# Patient Record
Sex: Male | Born: 1965 | Race: White | Hispanic: No | Marital: Married | Smoking: Current every day smoker
Health system: Southern US, Community
[De-identification: ages and names within clinical notes are randomized; demographics above are authoritative.]

## PROBLEM LIST (undated history)

## (undated) DIAGNOSIS — G4710 Hypersomnia, unspecified: Secondary | ICD-10-CM

## (undated) DIAGNOSIS — N529 Male erectile dysfunction, unspecified: Secondary | ICD-10-CM

## (undated) DIAGNOSIS — G47411 Narcolepsy with cataplexy: Secondary | ICD-10-CM

## (undated) DIAGNOSIS — IMO0002 Reserved for concepts with insufficient information to code with codable children: Secondary | ICD-10-CM

## (undated) HISTORY — DX: Reserved for concepts with insufficient information to code with codable children: IMO0002

## (undated) HISTORY — DX: Male erectile dysfunction, unspecified: N52.9

## (undated) HISTORY — DX: Narcolepsy with cataplexy: G47.411

## (undated) HISTORY — PX: OTHER SURGICAL HISTORY: SHX169

## (undated) HISTORY — PX: STERILIZATION: SHX533

## (undated) HISTORY — DX: Hypersomnia, unspecified: G47.10

## (undated) HISTORY — PX: CHOLECYSTECTOMY: SHX55

---

## 1997-10-31 ENCOUNTER — Inpatient Hospital Stay (HOSPITAL_COMMUNITY): Admission: EM | Admit: 1997-10-31 | Discharge: 1997-11-03 | Payer: Self-pay | Admitting: Emergency Medicine

## 2000-08-05 ENCOUNTER — Encounter: Payer: Self-pay | Admitting: Emergency Medicine

## 2000-08-05 ENCOUNTER — Encounter (INDEPENDENT_AMBULATORY_CARE_PROVIDER_SITE_OTHER): Payer: Self-pay | Admitting: Specialist

## 2000-08-05 ENCOUNTER — Inpatient Hospital Stay (HOSPITAL_COMMUNITY): Admission: EM | Admit: 2000-08-05 | Discharge: 2000-08-06 | Payer: Self-pay | Admitting: Emergency Medicine

## 2003-01-18 ENCOUNTER — Encounter: Payer: Self-pay | Admitting: Emergency Medicine

## 2003-01-18 ENCOUNTER — Emergency Department (HOSPITAL_COMMUNITY): Admission: EM | Admit: 2003-01-18 | Discharge: 2003-01-19 | Payer: Self-pay | Admitting: Emergency Medicine

## 2004-02-11 ENCOUNTER — Ambulatory Visit (HOSPITAL_COMMUNITY): Admission: RE | Admit: 2004-02-11 | Discharge: 2004-02-11 | Payer: Self-pay | Admitting: General Surgery

## 2004-02-11 ENCOUNTER — Encounter (INDEPENDENT_AMBULATORY_CARE_PROVIDER_SITE_OTHER): Payer: Self-pay | Admitting: *Deleted

## 2004-09-30 ENCOUNTER — Emergency Department (HOSPITAL_COMMUNITY): Admission: EM | Admit: 2004-09-30 | Discharge: 2004-09-30 | Payer: Self-pay | Admitting: Family Medicine

## 2006-03-28 ENCOUNTER — Emergency Department (HOSPITAL_COMMUNITY): Admission: EM | Admit: 2006-03-28 | Discharge: 2006-03-29 | Payer: Self-pay | Admitting: Emergency Medicine

## 2007-02-27 ENCOUNTER — Ambulatory Visit: Payer: Self-pay | Admitting: Family Medicine

## 2007-02-27 DIAGNOSIS — Z87891 Personal history of nicotine dependence: Secondary | ICD-10-CM | POA: Insufficient documentation

## 2007-02-27 DIAGNOSIS — G47419 Narcolepsy without cataplexy: Secondary | ICD-10-CM | POA: Insufficient documentation

## 2007-02-27 DIAGNOSIS — R12 Heartburn: Secondary | ICD-10-CM | POA: Insufficient documentation

## 2009-01-26 ENCOUNTER — Encounter: Admission: RE | Admit: 2009-01-26 | Discharge: 2009-01-26 | Payer: Self-pay | Admitting: Neurosurgery

## 2009-12-04 ENCOUNTER — Encounter: Admission: RE | Admit: 2009-12-04 | Discharge: 2009-12-04 | Payer: Self-pay | Admitting: Neurosurgery

## 2010-01-27 ENCOUNTER — Ambulatory Visit: Payer: Self-pay | Admitting: Family Medicine

## 2010-01-27 DIAGNOSIS — R21 Rash and other nonspecific skin eruption: Secondary | ICD-10-CM | POA: Insufficient documentation

## 2010-02-02 LAB — CONVERTED CEMR LAB
ALT: 81 units/L — ABNORMAL HIGH (ref 0–53)
AST: 41 units/L — ABNORMAL HIGH (ref 0–37)
Albumin: 4.2 g/dL (ref 3.5–5.2)
Alkaline Phosphatase: 61 units/L (ref 39–117)
Basophils Absolute: 0 10*3/uL (ref 0.0–0.1)
Basophils Relative: 0.4 % (ref 0.0–3.0)
Eosinophils Relative: 2.2 % (ref 0.0–5.0)
GFR calc non Af Amer: 81.68 mL/min (ref >60)
Glucose, Bld: 86 mg/dL (ref 70–99)
H Pylori IgG: NEGATIVE
HCT: 42.4 % (ref 39.0–52.0)
Hemoglobin: 14.4 g/dL (ref 13.0–17.0)
Lymphs Abs: 2.7 10*3/uL (ref 0.7–4.0)
Monocytes Relative: 10.1 % (ref 3.0–12.0)
Neutro Abs: 5 10*3/uL (ref 1.4–7.7)
Potassium: 5 meq/L (ref 3.5–5.1)
RBC: 4.42 M/uL (ref 4.22–5.81)
RDW: 13.6 % (ref 11.5–14.6)
Sodium: 143 meq/L (ref 135–145)
Total Protein: 6.7 g/dL (ref 6.0–8.3)

## 2010-02-03 ENCOUNTER — Encounter: Payer: Self-pay | Admitting: Family Medicine

## 2010-02-15 ENCOUNTER — Ambulatory Visit: Payer: Self-pay | Admitting: Family Medicine

## 2010-02-16 LAB — CONVERTED CEMR LAB
Albumin: 4.2 g/dL (ref 3.5–5.2)
GGT: 31 units/L (ref 7–51)
Total Protein: 6.9 g/dL (ref 6.0–8.3)

## 2010-02-17 LAB — CONVERTED CEMR LAB
Hep A IgM: NEGATIVE
Hep B C IgM: NEGATIVE
Hepatitis B Surface Ag: NEGATIVE

## 2010-03-22 ENCOUNTER — Encounter: Payer: Self-pay | Admitting: Family Medicine

## 2010-03-22 ENCOUNTER — Ambulatory Visit: Payer: Self-pay | Admitting: Family Medicine

## 2010-03-22 DIAGNOSIS — J069 Acute upper respiratory infection, unspecified: Secondary | ICD-10-CM | POA: Insufficient documentation

## 2010-03-22 DIAGNOSIS — F17200 Nicotine dependence, unspecified, uncomplicated: Secondary | ICD-10-CM | POA: Insufficient documentation

## 2010-03-22 DIAGNOSIS — J45909 Unspecified asthma, uncomplicated: Secondary | ICD-10-CM | POA: Insufficient documentation

## 2010-03-22 LAB — CONVERTED CEMR LAB
Nitrite: NEGATIVE
PSA: 0.36 ng/mL (ref 0.10–4.00)
Protein, U semiquant: NEGATIVE
Total CHOL/HDL Ratio: 5
Triglycerides: 102 mg/dL (ref 0.0–149.0)
Urobilinogen, UA: NEGATIVE
VLDL: 20.4 mg/dL (ref 0.0–40.0)

## 2010-03-25 ENCOUNTER — Encounter: Payer: Self-pay | Admitting: Family Medicine

## 2010-03-26 ENCOUNTER — Ambulatory Visit: Payer: Self-pay | Admitting: Family Medicine

## 2010-03-26 DIAGNOSIS — K921 Melena: Secondary | ICD-10-CM | POA: Insufficient documentation

## 2010-03-26 LAB — CONVERTED CEMR LAB: Fecal Occult Bld: POSITIVE

## 2010-03-29 ENCOUNTER — Encounter (INDEPENDENT_AMBULATORY_CARE_PROVIDER_SITE_OTHER): Payer: Self-pay | Admitting: *Deleted

## 2010-03-29 ENCOUNTER — Encounter: Payer: Self-pay | Admitting: Family Medicine

## 2010-07-22 NOTE — Letter (Signed)
Summary: [facility]  [facility]   Imported By: Lanelle Bal 02/11/2010 09:48:48  _____________________________________________________________________  External Attachment:    Type:   Image     Comment:   External Document

## 2010-07-22 NOTE — Letter (Signed)
Summary: New Patient letter  [facility]  [address] [place], [place] [address]   Phone: [phone]  Fax: [phone]       03/29/2010 MRN: [id number]  Marco Jackson [address] [place], [place]  [address]  Dear Marco Jackson,  Welcome to the Gastroenterology Division at [facility].    You are scheduled to see Dr. Jarold Motto on 05/04/2010 at 2:00PM on the 3rd floor at [facility], [address]  We ask that you try to arrive at our office 15 minutes prior to your appointment time to allow for check-in.  We would like you to complete the enclosed self-administered evaluation form prior to your visit and bring it with you on the day of your appointment.  We will review it with you.  Also, please bring a complete list of all your medications or, if you prefer, bring the medication bottles and we will list them.  Please bring your insurance card so that we may make a copy of it.  If your insurance requires a referral to see a specialist, please bring your referral form from your primary care physician.  Co-payments are due at the time of your visit and may be paid by cash, check or credit card.     Your office visit will consist of a consult with your physician (includes a physical exam), any laboratory testing he/she may order, scheduling of any necessary diagnostic testing (e.g. x-ray, ultrasound, CT-scan), and scheduling of a procedure (e.g. Endoscopy, Colonoscopy) if required.  Please allow enough time on your schedule to allow for any/all of these possibilities.    If you cannot keep your appointment, please call [phone] to cancel or reschedule prior to your appointment date.  This allows [place] the opportunity to schedule an appointment for another patient in need of care.  If you do not cancel or reschedule by 5 p.m. the business day prior to your appointment date, you will be charged a $50.00 late cancellation/no-show fee.    Thank you for choosing [facility] for your medical needs.  We appreciate the opportunity to care for you.  Please visit [place] at our website  to learn more about our practice.                     Sincerely,                                                             The Gastroenterology Division

## 2010-07-22 NOTE — Assessment & Plan Note (Signed)
Summary: red spots on chest/cbs   Vital Signs:  Patient profile:   45 year old male Height:      68.25 inches Weight:      216 pounds BMI:     32.72 Temp:     98.3 degrees F oral Pulse rate:   76 / minute BP sitting:   120 / 76  (left arm)  Vitals Entered By: Jeremy Johann CMA (January 27, 2010 11:19 AM) CC: RED SPOT ON CHEST, REFILL, Rash   History of Present Illness:  Rash      This is a 45 year old man who presents with Rash.  The symptoms began 6 months ago.  Pt states rash started with one spot on center of chestand now has spread across chest and some on arms as well.  It improved with fungal cream but then came right back.  The patient complains of macules and papules, but denies nodules, hives, welts, pustules, blisters, ulcers, itching, scaling, weeping, oozing, redness, increased warmth, and tenderness.  The rash is located on the neck, right arm, and left arm.  The patient denies the following symptoms: fever, headache, facial swelling, tongue swelling, burning, difficulty breathing, abdominal pain, nausea, vomiting, diarrhea, dizziness, sore throat, dysuria, eye symptoms, arthralgias, and vaginal discharge.  The patient denies history of recent tick bite, recent tick exposure, other insect bite, recent infection, recent antibiotic use, new medication, new clothing, new topical exposure, recent travel, pet/animal contact, thyroid disease, chronic liver disease, autoimmune disease, chronic edema, and prior STD.    Current Medications (verified): 1)  Provigil 200 Mg  Tabs (Modafinil) .Marland Kitchen.. 1 By Mouth Two Times A Day 2)  Protonix 40 Mg  Tbec (Pantoprazole Sodium) .Marland Kitchen.. 1 By Mouth Once Daily 3)  Prednisone 10 Mg Tabs (Prednisone) .... 3 By Mouth Once Daily For 3 Days Then 2 By Mouth Once Daily For 3 Days Then 1 By Mouth Once Daily For 3 Days  Allergies (verified): No Known Drug Allergies  Past History:  Past medical, surgical, family and social histories (including risk factors)  reviewed for relevance to current acute and chronic problems.  Past Surgical History: Reviewed history from 02/27/2007 and no changes required. Cholecystectomy Hemorrhoidectomy 2005--- [facility]  Family History: Reviewed history from 02/27/2007 and no changes required. ADOPTED  Social History: Reviewed history from 02/27/2007 and no changes required. 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     Impression & Recommendations:  Problem # 1:  SKIN RASH (ICD-782.1) prednisone taper benadryl as needed itching  Orders: Venipuncture (54098) TLB-BMP (Basic Metabolic Panel-BMET) (80048-METABOL) TLB-CBC Platelet - w/Differential (85025-CBCD) TLB-Hepatic/Liver Function Pnl (80076-HEPATIC) TLB-H. Pylori Abs(Helicobacter Pylori) (86677-HELICO) Depo- Medrol 80mg  (J1040) Admin of Therapeutic Inj  intramuscular or subcutaneous (11914)  Discussed medication use and symptom control.   Complete Medication List: 1)  Provigil 200 Mg Tabs (Modafinil) .Marland Kitchen.. 1 by mouth two times a day 2)  Protonix 40 Mg Tbec (Pantoprazole sodium) .Marland Kitchen.. 1 by mouth once daily 3)  Prednisone 10 Mg Tabs (Prednisone) .... 3 by mouth once daily for 3 days then 2 by mouth once daily for 3 days then 1 by mouth once daily for 3 days  Patient Instructions: 1)  schedule physical  Prescriptions: PREDNISONE 10 MG TABS (PREDNISONE) 3 by mouth once daily for 3 days then 2 by mouth once daily for 3 days then 1 by mouth once daily for 3 days  #18 x 0   Entered and Authorized by:   Loreen Freud DO   Signed by:   Loreen Freud DO on 01/27/2010   Method  used:   Electronically to        [facility]* (retail)       [address] [place], [place]  [address]       Ph: [phone] or [phone]       Fax: [phone]   RxID:   [id number] PROTONIX 40 MG  TBEC (PANTOPRAZOLE SODIUM) 1 by mouth once daily  #30 x 11   Entered and Authorized by:   Loreen Freud DO   Signed by:   Loreen Freud DO on 01/27/2010   Method used:   Electronically to        [facility]* (retail)       [address] [place], [place]  [address]       Ph: [phone] or [phone]       Fax: [phone]   RxID:   [id number]    Medication Administration  Injection # 1:    Medication: Depo- Medrol 80mg     Diagnosis: SKIN RASH (ICD-782.1)    Route: IM    Site: RUOQ gluteus    Exp Date: 10/19/2010    Lot #: [id number]    Mfr: [email or web address]    Patient tolerated injection without complications    Given by: Jeremy Johann CMA (January 27, 2010 11:48 AM)  Orders Added: 1)  Est. Patient Level III [57322] 2)  Venipuncture [02542] 3)  TLB-BMP (Basic Metabolic Panel-BMET) [80048-METABOL] 4)  TLB-CBC Platelet - w/Differential [85025-CBCD] 5)  TLB-Hepatic/Liver Function Pnl [80076-HEPATIC] 6)  TLB-H. Pylori Abs(Helicobacter Pylori) [86677-HELICO] 7)  Depo- Medrol 80mg  [J1040] 8)  Admin of Therapeutic Inj  intramuscular or subcutaneous [70623]

## 2010-07-22 NOTE — Assessment & Plan Note (Signed)
Summary: CPX//PH   Vital Signs:  Patient profile:   45 year old male Height:      69 inches Weight:      220.4 pounds Temp:     98.6 degrees F oral Pulse rate:   72 / minute Pulse rhythm:   regular BP sitting:   124 / 72  (right arm) Cuff size:   large  Vitals Entered By: Almeta Monas CMA Duncan Dull) (March 22, 2010 8:26 AM) CC: CPX/fasting, c/o rash on the chest   History of Present Illness: Pt here for cpe.  Pt still c/o rash on chest that is itchy.  Rash came back about 10 days after he finished prednisone last time---see last visit.   Pt is not putting anything on it now.     Preventive Screening-Counseling & Management  Alcohol-Tobacco     Alcohol type: OCASSIONALLY     Smoking Status: current     Smoking Cessation Counseling: yes     Packs/Day: 1.0     Year Started: 1980     Passive Smoke Exposure: yes  Caffeine-Diet-Exercise     Caffeine use/day: 5+     Does Patient Exercise: no     Type of exercise: WORK  Hep-HIV-STD-Contraception     HIV Risk: no     Dental Visit-last 6 months yes     Dental Care Counseling: not indicated; dental care within six months     TSE monthly: no     Testicular SE Education/Counseling not indicated; STE done regularly  Safety-Violence-Falls     Seat Belt Use: 100      Sexual History:  currently monogamous.    Current Medications (verified): 1)  Nuvigil 250 Mg Tabs (Armodafinil) .... 1/2 By Mouth Qid As Needed 2)  Protonix 40 Mg  Tbec (Pantoprazole Sodium) .Marland Kitchen.. 1 By Mouth Once Daily 3)  Diprolene Af 0.05 % Crea (Betamethasone Dipropionate Aug) .... Apply Two Times A Day 4)  Claritin 10 Mg Tabs (Loratadine) .Marland Kitchen.. 1 By Mouth Once Daily As Needed Allergies 5)  Flonase 50 Mcg/act Susp (Fluticasone Propionate) .... 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 ADOPTED  Social History: Reviewed history from 02/27/2007 and no changes required. 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 Eyes:  Denies blurring, discharge, double vision, eye irritation, eye pain, halos, itching, light sensitivity, red eye, vision loss-1 eye, and vision loss-both eyes; optho-- q2y, reading glasses . ENT:  Denies decreased hearing, difficulty swallowing, ear discharge, earache, hoarseness, nasal congestion, nosebleeds, postnasal drainage, ringing in ears, sinus pressure, and sore throat. CV:  Denies bluish discoloration of lips or nails, chest pain or discomfort, difficulty breathing at night, difficulty breathing while lying down, fainting, fatigue, leg cramps with exertion, lightheadness, near fainting, palpitations, shortness of breath with exertion, swelling of feet, swelling of hands, and weight gain. Resp:  Denies chest discomfort, chest pain with  inspiration, cough, coughing up blood, excessive snoring, hypersomnolence, morning headaches, pleuritic, shortness of breath, sputum productive, and wheezing. GI:  Denies abdominal pain, bloody stools, change in bowel habits, constipation, dark tarry stools, diarrhea, excessive appetite, gas, hemorrhoids, indigestion, loss of appetite, nausea, vomiting, vomiting blood, and yellowish skin color. GU:  Denies decreased libido, discharge, dysuria, erectile dysfunction, genital sores, hematuria, incontinence, nocturia, urinary frequency, and urinary hesitancy. MS:  Denies joint pain, joint redness, joint swelling, loss of strength, low back pain, mid back pain, muscle aches, muscle , cramps, muscle weakness, stiffness, and thoracic pain. Derm:  Denies changes in color of skin, changes in nail beds, dryness, excessive perspiration, flushing, hair loss, insect bite(s), itching, lesion(s), poor wound healing, and rash. Neuro:  Denies brief paralysis, difficulty with concentration, disturbances in coordination, falling down, headaches, inability to speak, memory loss, numbness, poor balance, seizures, sensation of room spinning, tingling, tremors, visual disturbances, and weakness; Dr Emilia Beck-- narcolepsy. Psych:  Denies alternate hallucination ( auditory/visual), anxiety, depression, easily angered, easily tearful, irritability, mental problems, panic attacks, sense of great danger, suicidal thoughts/plans, thoughts of violence, unusual visions or sounds, and thoughts /plans of harming others. Endo:  Denies cold intolerance, excessive hunger, excessive thirst, excessive urination, heat intolerance, polyuria, and weight change. Heme:  Denies abnormal bruising, bleeding, enlarge lymph nodes, fevers, pallor, and skin discoloration. Allergy:  Denies hives or rash, itching eyes, persistent infections, seasonal allergies, and sneezing.  Physical Exam  General:  Well-developed,well-nourished,in no acute distress;  alert,appropriate and cooperative throughout examination Skin:  papules on chest mid to Right  Cervical Nodes:  No lymphadenopathy noted Psych:  Cognition and judgment appear intact. Alert and cooperative with normal attention span and concentration. No apparent delusions, illusions, hallucinations   Impression & Recommendations:  Problem # 1:  PREVENTIVE HEALTH CARE (ICD-V70.0)  Orders: Venipuncture (04540) TLB-Lipid Panel (80061-LIPID) TLB-PSA (Prostate Specific Antigen) (84153-PSA) Specimen Handling (98119) UA Dipstick W/ Micro (manual) (81000) EKG w/ Interpretation (93000)  Reviewed preventive care protocols, scheduled due services, and updated immunizations.  Problem # 2:  TOBACCO USER (ICD-305.1)  Orders: Venipuncture (14782) TLB-Lipid Panel (80061-LIPID) TLB-PSA (Prostate Specific Antigen) (84153-PSA) EKG w/ Interpretation (93000)  Encouraged smoking cessation and discussed different methods for smoking cessation.   Problem # 3:  SKIN RASH (ICD-782.1)  His updated medication list for this problem includes:    Diprolene Af 0.05 % Crea (Betamethasone dipropionate aug) .Marland Kitchen... Apply two times a day  Orders: Dermatology Referral (Derma) EKG w/ Interpretation (93000)  Discussed medication use and symptom control.   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 1 by mouth once daily as needed allergies  Orders: EKG w/ Interpretation (93000)  Instructed on symptomatic treatment. Call if symptoms persist or worsen.   Complete Medication List: 1)  Nuvigil 250 Mg Tabs (Armodafinil) .... 1/2 by mouth qid as needed 2)  Protonix 40 Mg Tbec (Pantoprazole sodium) .Marland Kitchen.. 1 by mouth once daily 3)  Diprolene Af 0.05 % Crea (Betamethasone dipropionate aug) .... Apply two times  a day 4)   Claritin 10 Mg Tabs (Loratadine) .Marland Kitchen.. 1 by mouth once daily as needed allergies 5)  Flonase 50 Mcg/act Susp (Fluticasone propionate) .... 2 sprays each nostril once daily  Other Orders: Tdap => 13yrs IM ([address]) Admin 1st Vaccine ([address]) Prescriptions: FLONASE 50 MCG/ACT SUSP (FLUTICASONE PROPIONATE) 2 sprays each nostril once daily  #1 x 1   Entered and Authorized by:   Loreen Freud DO   Signed by:   Loreen Freud DO on 03/22/2010   Method used:   Electronically to        [facility]* (retail)       [address] [place], [place]  [address]       Ph: [phone] or [phone]       Fax: [phone]   RxID:   [id number] CLARITIN 10 MG TABS (LORATADINE) 1 by mouth once daily as needed allergies  #30 x 5   Entered and Authorized by:   Loreen Freud DO   Signed by:   Loreen Freud DO on 03/22/2010   Method used:   Electronically to        [facility]* (retail)       [address] [place], [place]  [address]       Ph: [phone] or [phone]       Fax: [phone]   RxID:   [id number] DIPROLENE AF 0.05 % CREA (BETAMETHASONE DIPROPIONATE AUG) apply two times a day  #30g x 1   Entered and Authorized by:   Loreen Freud DO   Signed by:   Loreen Freud DO on 03/22/2010   Method used:   Electronically to        [facility]* (retail)       [address] [place], [place]  [address]       Ph: [phone] or [phone]       Fax: [phone]   RxID:   [id number]   Flu Vaccine Next Due:  Refused      Immunizations Administered:  Tetanus Vaccine:    Vaccine Type: Tdap    Site: right deltoid    Mfr: Merck    Dose: 0.5 ml    Route: IM    Given by: Almeta Monas CMA (AAMA)    Exp. Date: 04/08/2012    Lot #: [id number]    VIS given: 05/07/08 version given March 22, 2010.   Immunizations Administered:  Tetanus Vaccine:    Vaccine Type: Tdap    Site: right deltoid    Mfr: Merck    Dose: 0.5 ml    Route: IM    Given by: Almeta Monas CMA (AAMA)    Exp. Date: 04/08/2012    Lot #: [id number]    VIS given: 05/07/08 version given March 22, 2010.   Laboratory Results   Urine Tests   Date/Time Reported: March 22, 2010 9:58 AM   Routine Urinalysis   Color: yellow Appearance: Clear Glucose: negative   (Normal Range: Negative) Bilirubin: negative   (Normal Range: Negative) Ketone: negative   (Normal Range: Negative) Spec. Gravity: 1.010   (Normal Range: 1.003-1.035) Blood: negative   (  Normal Range: Negative) pH: 5.0   (Normal Range: 5.0-8.0) Protein: negative   (Normal Range: Negative) Urobilinogen: negative   (Normal Range: 0-1) Nitrite: negative   (Normal Range: Negative) Leukocyte Esterace: negative   (Normal Range: Negative)    Comments: Floydene Flock  March 22, 2010 9:58 AM

## 2010-07-22 NOTE — Consult Note (Signed)
Summary: [facility]  [facility]   Imported By: Lanelle Bal 04/20/2010 12:24:40  _____________________________________________________________________  External Attachment:    Type:   Image     Comment:   External Document

## 2010-07-22 NOTE — Letter (Signed)
Summary: [facility]: Immunoassay Fecal Occult Blood (iFOB) Order Form  [facility] [place]  [address] [place], [place] [address]   Phone: [phone]  Fax: [phone]      [facility]: Immunoassay Fecal Occult Blood (iFOB) Order Form   March 25, 2010 MRN: [id number]   YUVRAJ PFEIFER 1965/09/01   Physicican Name: Dr.Lowne  Diagnosis Code: V56.71      Almeta Monas CMA (AAMA)

## 2010-08-27 ENCOUNTER — Ambulatory Visit (INDEPENDENT_AMBULATORY_CARE_PROVIDER_SITE_OTHER): Payer: 59 | Admitting: Family Medicine

## 2010-08-27 ENCOUNTER — Encounter: Payer: Self-pay | Admitting: Family Medicine

## 2010-08-27 DIAGNOSIS — H6980 Other specified disorders of Eustachian tube, unspecified ear: Secondary | ICD-10-CM

## 2010-08-31 NOTE — Assessment & Plan Note (Signed)
Summary: blood in ear, sore throat///sph--needed early morning appt///...   Vital Signs:  Patient profile:   45 year old male Height:      69 inches (175.26 cm) Weight:      220.38 pounds (100.17 kg) BMI:     32.66 Temp:     97.8 degrees F (36.56 degrees C) oral BP sitting:   120 / 70  (left arm) Cuff size:   large  Vitals Entered By: Lucious Groves CMA (August 27, 2010 9:00 AM) CC: Blood in ear and sore throat./kb Is Patient Diabetic? No Pain Assessment Patient in pain? no      Comments Patient notes that sore throat is better. He has been having congestion, and cough. He denies fever, HA, mucous production, chest pain, and SOB.   History of Present Illness: 45 yo man here today for blood in L ear.  uses Qtips each morning and 1 month ago noticed blood on Qtip.  able to hear.  no ear pain.  feels 'fluid' in ear.  no fevers.  + nasal congestion.  denies PND.  denies seasonal allergies but was previously on claritin..  Current Medications (verified): 1)  Protonix 40 Mg  Tbec (Pantoprazole Sodium) .Marland Kitchen.. 1 By Mouth Once Daily 2)  Provigil .Marland Kitchen.. 1 By Mouth Two Times A Day  Allergies (verified): No Known Drug Allergies  Review of Systems      See HPI  Physical Exam  General:  Well-developed,well-nourished,in no acute distress; alert,appropriate and cooperative throughout examination Head:  NCAT Eyes:  no injxn or inflammation Ears:  R ear normal L canal w/ small scab inferiorly, TM retracted Nose:  edematous turbinates and congestion, L>R   Impression & Recommendations:  Problem # 1:  DYSFUNCTION OF EUSTACHIAN TUBE (ICD-381.81) Assessment New pt's ear sxs are related to TM retraction likely caused by untreated seasonal allergies.  start steroid nasal spray.  advised pt to stop using Qtips.  Complete Medication List: 1)  Protonix 40 Mg Tbec (Pantoprazole sodium) .Marland Kitchen.. 1 by mouth once daily 2)  Provigil  .Marland Kitchen.. 1 by mouth two times a day 3)  Fluticasone Propionate 50 Mcg/act  Susp (Fluticasone propionate) .... 2 sprays each nostril once daily  Patient Instructions: 1)  Your ear discomfort is due to nasal congestion and seasonal allergies 2)  Use the nasal spray to decrease congestion and open your ear 3)  Drink plenty of fluids 4)  Call with any questions or concerns 5)  Enjoy your trip!!! Prescriptions: FLUTICASONE PROPIONATE 50 MCG/ACT  SUSP (FLUTICASONE PROPIONATE) 2 sprays each nostril once daily  #1 x 3   Entered and Authorized by:   Neena Rhymes MD   Signed by:   Neena Rhymes MD on 08/27/2010   Method used:   Electronically to        [facility]* (retail)       [address] [place], [place]  [address]       Ph: [phone] or [phone]       Fax: [phone]   RxID:   [id number]    Orders Added: 1)  Est. Patient Level III [[address]]

## 2010-10-30 ENCOUNTER — Encounter: Payer: Self-pay | Admitting: Family Medicine

## 2010-11-01 ENCOUNTER — Ambulatory Visit (INDEPENDENT_AMBULATORY_CARE_PROVIDER_SITE_OTHER): Payer: 59 | Admitting: Family Medicine

## 2010-11-01 ENCOUNTER — Encounter: Payer: Self-pay | Admitting: Family Medicine

## 2010-11-01 VITALS — BP 110/72 | HR 73 | Temp 98.8°F | Wt 218.4 lb

## 2010-11-01 DIAGNOSIS — Z720 Tobacco use: Secondary | ICD-10-CM

## 2010-11-01 DIAGNOSIS — N529 Male erectile dysfunction, unspecified: Secondary | ICD-10-CM | POA: Insufficient documentation

## 2010-11-01 DIAGNOSIS — F17200 Nicotine dependence, unspecified, uncomplicated: Secondary | ICD-10-CM

## 2010-11-01 MED ORDER — VARENICLINE TARTRATE 0.5 MG X 11 & 1 MG X 42 PO MISC: ORAL | Status: DC

## 2010-11-01 NOTE — Assessment & Plan Note (Signed)
rx chantix Encouraged pt to call 800 number for extra counseling over phone and mail

## 2010-11-01 NOTE — Assessment & Plan Note (Signed)
Check testosterone level viagra trial--- pt understands directions

## 2010-11-01 NOTE — Patient Instructions (Signed)
ED (Erectile Dysfunction)     Erectile Dysfunction (ED) is a very common condition occurring in almost all men at various times in their lives. It can be caused by fatigue, overwork, preoccupation with work, alcohol and numerous other drugs. This problem can also be psychogenic. That means the problem is caused in your mind. Do not worry, this problem is extremely common. Sometimes with failure of sexual function on a couple of occasions a man will worry enough so that this normal bodily function fails again. There are other problems with sexual function that may be caused by organic problems, such as diabetes, nerve disorders, and hardening of the arteries (arteriosclerosis and atherosclerosis).     DIAGNOSIS OF ED  Your caregiver can often diagnose ED by taking a history and doing a physical exam. Once your caregiver has made sure that you do not have an organic (caused by disease) problem you may be given medications to try.      TREATMENT  Once your caregiver has determined there are no other causes for your ED, medications may help with this problem. Your caregiver may even wish to give you medication and bypass some of the former work ups used in evaluation. A number of  medications have been approved in recent years to help with this problem. These medicines should not be taken more than once a day. There may be severe side effects if taken by men who are also taking nitroglycerin medications or alpha blockers. These are used for blood pressure control and prostate problems. Other injectable and topical medications have been approved for ED. Vacuum devices and implants have also been used.  Contact your caregiver and arrange for further evaluation as recommended.     Document Released: 06/03/2000  Document Re-Released: 04/03/2007  ExitCare Patient Information 2011 [employer].

## 2010-11-01 NOTE — Progress Notes (Signed)
  Subjective:    Patient ID: Marco Jackson, male    DOB: 15-Jan-1966, 45 y.o.   MRN: [id number]  HPI Pt here to discuss chantix.  He used it before and was successful for 6 months and then started again.  Pt also c/o ED and would like to have his testosterone checked.      Review of Systems  Constitutional: Negative.   Respiratory: Negative.   Cardiovascular: Negative.   Psychiatric/Behavioral: Negative.   All other systems reviewed and are negative.       Objective:   Physical Exam  Constitutional: He appears well-developed and well-nourished.  Cardiovascular: Normal rate, regular rhythm and normal heart sounds.   Pulmonary/Chest: Effort normal and breath sounds normal.  Psychiatric: He has a normal mood and affect. His behavior is normal. Judgment and thought content normal.          Assessment & Plan:

## 2010-11-03 ENCOUNTER — Encounter: Payer: Self-pay | Admitting: *Deleted

## 2010-11-03 LAB — TESTOSTERONE, FREE, TOTAL, SHBG
Sex Hormone Binding: 41 nmol/L (ref 13–71)
Testosterone: 515.74 ng/dL (ref 250–890)

## 2010-11-05 NOTE — Op Note (Signed)
[facility]  Patient:    Marco Jackson, Marco Jackson                         MRN: [id number] Proc. Date: 08/05/00 Adm. Date:  [id number] Attending:  Henrene Dodge                           Operative Report  PREOPERATIVE DIAGNOSIS:  Acute cholecystitis or biliary colic.  POSTOPERATIVE DIAGNOSIS:  Chronic cholecystitis with stones.  OPERATION:  Laparoscopic cholecystectomy.  SURGEON:  Anselm Pancoast. Zachery Dakins, M.D.  ASSISTANT:  Catalina Lunger, M.D.  ANESTHESIA:  General anesthesia.  HISTORY:  The patient is a 45 year old Caucasian male who presented to the emergency room after having the severe onset of epigastric pain approximately 9 p.m. on Friday evening after eating at a [employer].  Patient arrived in the emergency room at approximately midnight and was seen by the ER physician.  Laboratory studies showed a white count of 19,600.  He was having pretty moderate nausea, vomiting and epigastric pain and he was medicated and this morning, an ultrasound of the gallbladder was obtained which showed a stone within the distended gallbladder.  Patient was given Toradol and his acute symptoms appeared to subside.  I was asked to see him and on examination, his pain was still kind of mild epigastric, more to the right, but he certainly did not appear like a WBC of approximately 20,000.  I recommended that we obtain an acute abdominal series, which was unremarkable, and then I recommended that we proceed on with a laparoscopic cholecystectomy. His pain actually has subsided significantly now but with about 12 hours that he has been here in the emergency room, this most likely was a biliary colic attack.  Patient was given Unasyn 3 g and was taken to the operating suite.  DESCRIPTION OF PROCEDURE:  With induction of general anesthesia, endotracheal tube and an oral tube into the stomach, the abdomen was prepped with Betadine surgical scrubbing  solution and draped in a sterile manner.  A small incision was made below the umbilicus down through the fatty tissue, the fascia identified, a small opening made and the underlying peritoneum identified, which was opened carefully with a hemostat.  The traction suture was placed and Hasson cannula introduced.  On examination of the upper abdomen, he had a distended gallbladder but it was not really acutely inflamed.  There was no evidence of any other acute inflammation.  Later, after the gallbladder was removed, I did visualized the appendix and it appeared to be in its normal position and of normal appearance.  The gallbladder was retracted upward after the upper 10-mm trocar had been placed and the two lateral 5-mm trocars were placed by Dr. Luan Pulling after anesthetizing the fascia.  The peritoneum was opened up proximally.  The cystic duct was fairly short, not real dilated, and I placed a clip flush with the junction of the cystic duct and the gallbladder and then made a little nick in it proximally -- the bile was not under extreme pressure -- and then two clips were placed on the proximal portion of the cystic duct and it was divided.  I saw a couple of little branches of the cystic artery that were doubly clipped proximally and then these divided, freeing up the gallbladder bed.  The patient had real good hemostasis.  The gallbladder was freed from its bed and then  the camera was placed in the upper 10-mm trocar site and the gallbladder withdrawn through the fascial defect in the umbilicus.  The stone itself was about a centimeter in size.  We then reinserted the camera, looked around, visualizing the appendix, and the small bowel looked unremarkable with no evidence of any Meckels or anything.  I think that he actually had an episode of biliary colic and was having significant nausea and vomiting when he arrived and the WBC count was probably a little out of proportion to the  severity of his illness.  Patients umbilical fascia was closed with a figure-of-eight of 0 Vicryl.  The two lateral 5-mm trocars were withdrawn, there was no evidence of any bleeding, the CO2 released and the upper trocar withdrawn.  Patient tolerated procedure satisfactorily.  The subcutaneous wounds were closed with 4-0 Vicryl with Benzoin and Steri-Strips on the skin. DD:  08/05/00 TD:  08/06/00 Job: [id number]

## 2010-11-05 NOTE — Op Note (Signed)
Marco Jackson, Marco Jackson                            ACCOUNT NO.:  [id number]   MEDICAL RECORD NO.:  [id number]                   PATIENT TYPE:  AMB   LOCATION:  DAY                                  FACILITY[facility]  [facility]   PHYSICIAN:  Ollen Gross. Vernell Morgans, M.D.              DATE OF BIRTH:  1965/09/11   DATE OF PROCEDURE:  02/11/2004  DATE OF DISCHARGE:                                 OPERATIVE REPORT   PREOPERATIVE DIAGNOSIS:  Prolapsing internal hemorrhoids.   POSTOPERATIVE DIAGNOSIS:  Prolapsing internal hemorrhoids.   PROCEDURE:  Procedure for prolapsing hemorrhoids/hemorrhoidectomy.   SURGEON:  Ollen Gross. Carolynne Edouard, M.D.   ASSISTANT:  Anselm Pancoast. Zachery Dakins, M.D.   ANESTHESIA:  General endotracheal.   PROCEDURE:  After informed consent was obtained, the patient was brought to  the operating room and left in the supine position on the bed.  After  adequate induction of general anesthesia, the patient was then rolled into a  prone position onto the operating room table, and all pressure points were  padded.  The patient was placed in a jack-knife position, and his buttocks  were retracted laterally with tape.  His perirectal region and rectum were  prepped with Betadine and draped in the usual sterile manner.  A deep  Pfannenstiel retractor was used to examine the rectum.  He was noted to have  significant internal hemorrhoidal tissue, but no other abnormalities were  identified.  The perirectal area and hemorrhoids were infiltrated initially  with 1% lidocaine with epinephrine and 1 cc of Wydase.  The tissue was  massaged gently for several minutes.  The area is then further anesthetized  with just 0.25% Marcaine.  The deep fans of the retractor was then placed  into the rectum again at a distance of about 4-5 cm deep to the dentate  line.  A 2-0 Prolene purse-string stitch was placed circumferentially around  the rectum just to the mucosa and submucosa, taking care to start each  stitch at the end  of the last stitch.  This was done circumferentially.  The  deep Pfannenstiel was then removed.  The ends of the Prolene were then  brought through the clear plastic retractor and white trocar, and the clear  plastic retractor and white trocar were then placed within the rectum, as  deep as they could be placed.  The white trocar was then removed.  The  stapling device was then placed within the rectum, and it was felt to drop  below the purse-string stitch.  The purse-string stitch was then cinched  down tightly and tied.  The ends of the purse-string were then brought  through the lateral holes in the stapling device, and gentle traction was  placed on the purse-string stitch.  The stapling device was then advanced  into the rectum and tightened down as far as it could go, and a minute was  allowed  to pass.  At this point, the __________ line was well below the edge  of the clear plastic trocar.  After a minute, the stapling device was fired.  Another minute was allowed to pass.  The stapling device was unscrewed a  full turn and then gently removed from the rectum.  The specimen was  examined and appeared to be very uniform with no muscle identified with two  large hemorrhoidal cuffs and was sent to pathology for further evaluation.  The staple line was inspected, and there were several small bleeding points  that were oozing, and these were controlled with figure-of-eight 3-0 Vicryl  interrupted stitches.  The staple line was then examined for several minutes  and appeared to be hemostatic and approximately 1-2 cm deep to the dentate  line circumferentially.  Lidocaine jelly was then placed within the rectum,  and a welled-up Gelfoam was then placed within the rectum.  Sterile dressings were then applied.  The patient tolerated the procedure  well.  At the end of the case, all needle, sponge, and instrument counts  were correct.  The patient was then rolled back into a supine position  on  the bed, awakened and taken to the recovery room in stable condition.                                               Ollen Gross. Vernell Morgans, M.D.    PST/MEDQ  D:  02/11/2004  T:  02/11/2004  Job:  [id number]

## 2011-05-09 ENCOUNTER — Other Ambulatory Visit: Payer: Self-pay | Admitting: Family Medicine

## 2011-05-09 MED ORDER — PANTOPRAZOLE SODIUM 40 MG PO TBEC: 40.0000 mg | DELAYED_RELEASE_TABLET | Freq: Every day | ORAL | Status: DC

## 2011-05-09 NOTE — Telephone Encounter (Signed)
Faxed.   KP 

## 2011-12-01 ENCOUNTER — Encounter: Payer: 59 | Admitting: Family Medicine

## 2012-04-23 ENCOUNTER — Telehealth: Payer: Self-pay

## 2012-04-23 NOTE — Telephone Encounter (Signed)
Pt called stating having scrotum issues with a growth on it wants to be seen ASAP.  I scheduled him an appt for 04/24/12 at 11am with Lowne. Pt stated understanding.   MW

## 2012-04-24 ENCOUNTER — Telehealth: Payer: Self-pay | Admitting: Gastroenterology

## 2012-04-24 ENCOUNTER — Ambulatory Visit (INDEPENDENT_AMBULATORY_CARE_PROVIDER_SITE_OTHER): Payer: 59 | Admitting: Family Medicine

## 2012-04-24 ENCOUNTER — Encounter: Payer: Self-pay | Admitting: Family Medicine

## 2012-04-24 ENCOUNTER — Encounter: Payer: Self-pay | Admitting: Gastroenterology

## 2012-04-24 VITALS — BP 120/76 | HR 70 | Temp 98.2°F | Wt 197.2 lb

## 2012-04-24 DIAGNOSIS — N508 Other specified disorders of male genital organs: Secondary | ICD-10-CM

## 2012-04-24 DIAGNOSIS — K625 Hemorrhage of anus and rectum: Secondary | ICD-10-CM

## 2012-04-24 DIAGNOSIS — R350 Frequency of micturition: Secondary | ICD-10-CM

## 2012-04-24 DIAGNOSIS — N5089 Other specified disorders of the male genital organs: Secondary | ICD-10-CM

## 2012-04-24 DIAGNOSIS — R634 Abnormal weight loss: Secondary | ICD-10-CM

## 2012-04-24 LAB — CBC WITH DIFFERENTIAL/PLATELET
Basophils Relative: 0.5 % (ref 0.0–3.0)
Eosinophils Relative: 1.4 % (ref 0.0–5.0)
HCT: 46.4 % (ref 39.0–52.0)
Lymphs Abs: 3 10*3/uL (ref 0.7–4.0)
MCV: 96.5 fl (ref 78.0–100.0)
Monocytes Absolute: 0.7 10*3/uL (ref 0.1–1.0)
Neutro Abs: 4.8 10*3/uL (ref 1.4–7.7)
RBC: 4.81 Mil/uL (ref 4.22–5.81)
WBC: 8.7 10*3/uL (ref 4.5–10.5)

## 2012-04-24 LAB — HEPATIC FUNCTION PANEL
ALT: 31 U/L (ref 0–53)
Albumin: 4.7 g/dL (ref 3.5–5.2)
Total Protein: 7.5 g/dL (ref 6.0–8.3)

## 2012-04-24 LAB — PSA: PSA: 0.4 ng/mL (ref 0.10–4.00)

## 2012-04-24 LAB — BASIC METABOLIC PANEL
BUN: 12 mg/dL (ref 6–23)
CO2: 26 mEq/L (ref 19–32)
Chloride: 104 mEq/L (ref 96–112)
Glucose, Bld: 90 mg/dL (ref 70–99)
Potassium: 4.1 mEq/L (ref 3.5–5.1)

## 2012-04-24 LAB — POCT URINALYSIS DIPSTICK
Bilirubin, UA: NEGATIVE
Blood, UA: NEGATIVE
Glucose, UA: NEGATIVE
Spec Grav, UA: >=1.03

## 2012-04-24 NOTE — Progress Notes (Signed)
  Subjective:    Patient ID: Marco Jackson, male    DOB: [birth year], 46 y.o.   MRN: [id number]  HPI Pt here c/o testicular nodule that is tender.  He noticed it 3 days ago.   He has had some urinary frequency as well.  At end of visit he admitted to urinary frequency , especially at night --he gets up several times a night.   He has also noticed blood in his stool.  He does have a hx of hemrrhoids and has seen surgery in past for this.  Pt also worried about 10 lbs weight loss since September.      Review of Systems As above    Objective:   Physical Exam  Nursing note and vitals reviewed. Constitutional: He appears well-developed and well-nourished.  Abdominal: Soft. Bowel sounds are normal.  Genitourinary: Penis normal. Guaiac negative stool. No penile tenderness.       Testicular exam-- min tenderness R testicular exam--? Small pea sized nodule Heme neg brown stool Prostate slightly enlarged but smooth  Psychiatric: He has a normal mood and affect. His behavior is normal. Judgment and thought content normal.          Assessment & Plan:  Testicular nodule--   Refer to urology for evaluation                                  UA normal  Urinary frequency--- ua normal                             Prostate enlarged                            To see urology Rectal bleed-- heme neg in office today---refer to GI    GERD--- pt admits to not taking protonix regularly---he will start

## 2012-04-24 NOTE — Patient Instructions (Addendum)
Testicular Problems and Self-Exam  Men can examine themselves easily and effectively with positive results. Monthly exams detect problems early and save lives. There are numerous causes of swelling in the testicle. Testicular cancer usually appears as a firm painless lump in the front part of the testicle. This may feel like a dull ache or heavy feeling located in the lower abdomen (belly), groin, or scrotum.   The risk is greater in men with undescended testicles and it is more common in young men. It is responsible for almost a fifth of cancers in males between ages 15 and 34. Other common causes of swellings, lumps, and testicular pain include injuries, inflammation (soreness) from infection, hydrocele, and torsion. These are a few of the reasons to do monthly self-examination of the testicles. The exam only takes minutes and could add years to your life. Get in the habit!  SELF-EXAMINATION OF THE TESTICLES  The testicles are easiest to examine after warm baths or showers and are more difficult to examine when you are cold. This is because the muscles attached to the testicles retract and pull them up higher or into the abdomen. While standing, roll one testicle between the thumb and forefinger. Feel for lumps, swelling, or discomfort. A normal testicle is egg shaped and feels firm. It is smooth and not tender. The spermatic cord can be felt as a firm spaghetti-like cord at the back of the testicle. It is also important to examine your groins. This is the crease between the front of your leg and your abdomen. Also, feel for enlarged lymph nodes (glands). Enlarged nodes are also a cause for you to see your caregiver for evaluation.   Self-examination of the testicles and groin areas on a regular basis will help you to know what your own testicles and groins feel like. This will help you pick up an abnormality (difference) at an earlier stage. Early discovery is the key to curing this cancer or treating other  conditions. Any lump, change, or swelling in the testicle calls for immediate evaluation by your caregiver. Cancer of the testicle does not result in impotence and it does not prevent normal intercourse or prevent having children. If your caregiver feels that medical treatment or chemotherapy could lead to infertility, sperm can be frozen for future use. It is necessary to see a caregiver as soon as possible after the discovery of a lump in a testicle.  Document Released: 09/12/2000 Document Revised: 08/29/2011 Document Reviewed: 06/07/2008  ExitCare® Patient Information ©2013 [employer].

## 2012-04-24 NOTE — Telephone Encounter (Signed)
Left message for patient to call back  

## 2012-04-26 ENCOUNTER — Telehealth: Payer: Self-pay

## 2012-04-26 NOTE — Telephone Encounter (Signed)
Pt wife LMOVM stating husband was in for gross on testicle and had labs want results.  Called pt wife back on cell provided [phone] LMOVM advising results not available yet.   MW

## 2012-04-27 NOTE — Telephone Encounter (Signed)
Left message for patient to call back  

## 2012-04-30 NOTE — Telephone Encounter (Signed)
Patient reports he was calling to verify his appt.  He is advised that it is scheduled for 05/10/12 10:45

## 2012-05-10 ENCOUNTER — Encounter: Payer: Self-pay | Admitting: Gastroenterology

## 2012-05-10 ENCOUNTER — Telehealth: Payer: Self-pay

## 2012-05-10 ENCOUNTER — Ambulatory Visit (INDEPENDENT_AMBULATORY_CARE_PROVIDER_SITE_OTHER): Payer: 59 | Admitting: Gastroenterology

## 2012-05-10 VITALS — BP 112/68 | HR 60 | Ht 68.0 in | Wt 198.8 lb

## 2012-05-10 DIAGNOSIS — K921 Melena: Secondary | ICD-10-CM

## 2012-05-10 DIAGNOSIS — K5900 Constipation, unspecified: Secondary | ICD-10-CM

## 2012-05-10 MED ORDER — PEG-KCL-NACL-NASULF-NA ASC-C 100 G PO SOLR: 1.0000 | Freq: Once | ORAL | Status: DC

## 2012-05-10 NOTE — Patient Instructions (Addendum)
You have been given a separate informational sheet regarding your tobacco use, the importance of quitting and local resources to help you quit.  You have been scheduled for a colonoscopy with propofol. Please follow written instructions given to you at your visit today.  Please pick up your prep kit at the pharmacy within the next 1-3 days. If you use inhalers (even only as needed) or a CPAP machine, please bring them with you on the day of your procedure.  High-Fiber Diet Fiber is found in fruits, vegetables, and grains. A high-fiber diet encourages the addition of more whole grains, legumes, fruits, and vegetables in your diet. The recommended amount of fiber for adult males is 38 g per day. For adult females, it is 25 g per day. Pregnant and lactating women should get 28 g of fiber per day. If you have a digestive or bowel problem, ask your caregiver for advice before adding high-fiber foods to your diet. Eat a variety of high-fiber foods instead of only a select few type of foods.  PURPOSE  To increase stool bulk.  To make bowel movements more regular to prevent constipation.  To lower cholesterol.  To prevent overeating. WHEN IS THIS DIET USED?  It may be used if you have constipation and hemorrhoids.  It may be used if you have uncomplicated diverticulosis (intestine condition) and irritable bowel syndrome.  It may be used if you need help with weight management.  It may be used if you want to add it to your diet as a protective measure against atherosclerosis, diabetes, and cancer. SOURCES OF FIBER  Whole-grain breads and cereals.  Fruits, such as apples, oranges, bananas, berries, prunes, and pears.  Vegetables, such as green peas, carrots, sweet potatoes, beets, broccoli, cabbage, spinach, and artichokes.  Legumes, such split peas, soy, lentils.  Almonds. FIBER CONTENT IN FOODS Starches and Grains / Dietary Fiber (g)  Cheerios, 1 cup / 3 g  Corn Flakes cereal, 1 cup  / 0.7 g  Rice crispy treat cereal, 1 cup / 0.3 g  Instant oatmeal (cooked),  cup / 2 g  Frosted wheat cereal, 1 cup / 5.1 g  Brown, long-grain rice (cooked), 1 cup / 3.5 g  White, long-grain rice (cooked), 1 cup / 0.6 g  Enriched macaroni (cooked), 1 cup / 2.5 g Legumes / Dietary Fiber (g)  Baked beans (canned, plain, or vegetarian),  cup / 5.2 g  Kidney beans (canned),  cup / 6.8 g  Pinto beans (cooked),  cup / 5.5 g Breads and Crackers / Dietary Fiber (g)  Plain or honey graham crackers, 2 squares / 0.7 g  Saltine crackers, 3 squares / 0.3 g  Plain, salted pretzels, 10 pieces / 1.8 g  Whole-wheat bread, 1 slice / 1.9 g  White bread, 1 slice / 0.7 g  Raisin bread, 1 slice / 1.2 g  Plain bagel, 3 oz / 2 g  Flour tortilla, 1 oz / 0.9 g  Corn tortilla, 1 small / 1.5 g  Hamburger or hotdog bun, 1 small / 0.9 g Fruits / Dietary Fiber (g)  Apple with skin, 1 medium / 4.4 g  Sweetened applesauce,  cup / 1.5 g  Banana,  medium / 1.5 g  Grapes, 10 grapes / 0.4 g  Orange, 1 small / 2.3 g  Raisin, 1.5 oz / 1.6 g  Melon, 1 cup / 1.4 g Vegetables / Dietary Fiber (g)  Green beans (canned),  cup / 1.3 g  Carrots (  cooked),  cup / 2.3 g  Broccoli (cooked),  cup / 2.8 g  Peas (cooked),  cup / 4.4 g  Mashed potatoes,  cup / 1.6 g  Lettuce, 1 cup / 0.5 g  Corn (canned),  cup / 1.6 g  Tomato,  cup / 1.1 g Document Released: 06/06/2005 Document Revised: 12/06/2011 Document Reviewed: 09/08/2011 [facility] Patient Information 2013 [place], [place].

## 2012-05-10 NOTE — Telephone Encounter (Signed)
Pt wife LMOVM advising she called 2 weeks ago concerning growth on pt testicles needing pt PSI also stating never received cb or letter mailed. PLz advise pt wife SEE NOTE FROM 04/26/12 CB: [phone] or [phone]

## 2012-05-10 NOTE — Progress Notes (Signed)
History of Present Illness: This is a has had constipation and intermittent rectal bleeding. He has a history of hemorrhoids and underwent a hemorrhoid procedure about 8-10 years ago. He states he has had mild constipation recently and has had a few episodes with small amounts of bright red blood per rectum. Denies weight loss, abdominal pain, diarrhea, change in stool caliber, melena, nausea, vomiting, dysphagia, reflux symptoms, chest pain.  Review of Systems: Pertinent positive and negative review of systems were noted in the above HPI section. All other review of systems were otherwise negative.  Current Medications, Allergies, Past Medical History, Past Surgical History, Family History and Social History were reviewed in [employer] record.  Physical Exam: General: Well developed , well nourished, no acute distress Head: Normocephalic and atraumatic Eyes:  sclerae anicteric, EOMI Ears: Normal auditory acuity Mouth: No deformity or lesions Neck: Supple, no masses or thyromegaly Lungs: Clear throughout to auscultation Heart: Regular rate and rhythm; no murmurs, rubs or bruits Abdomen: Soft, non tender and non distended. No masses, hepatosplenomegaly or hernias noted. Normal Bowel sounds Rectal: deferred to colonoscopy, recent DRE by Dr. Laury Axon showed heme - stool and no anal or rectal lesions Musculoskeletal: Symmetrical with no gross deformities  Skin: No lesions on visible extremities Pulses:  Normal pulses noted Extremities: No clubbing, cyanosis, edema or deformities noted Neurological: Alert oriented x 4, grossly nonfocal Cervical Nodes:  No significant cervical adenopathy Inguinal Nodes: No significant inguinal adenopathy Psychological:  Alert and cooperative. Normal mood and affect  Assessment and Recommendations:  1. Small volume hematochezia. Probably benign anorectal source. R/O colorectal neoplasm. The risks, benefits, and alternatives to colonoscopy  with possible biopsy and possible polypectomy were discussed with the patient and they consent to proceed.

## 2012-05-10 NOTE — Telephone Encounter (Signed)
Discussed with patient and he stated the nodule in his testicles is very painful he wanted the results of his labs and I made him aware that they were all normal, his apt with Dr. Isabel Caprice on 05/21/12 and he is willing to wait until then instead of coming back into the office for another evaluation. He was just concerned about his PSA results.      KP

## 2012-07-03 ENCOUNTER — Encounter: Payer: 59 | Admitting: Gastroenterology

## 2012-07-05 ENCOUNTER — Encounter: Payer: Self-pay | Admitting: Gastroenterology

## 2012-07-05 ENCOUNTER — Ambulatory Visit (AMBULATORY_SURGERY_CENTER): Payer: 59 | Admitting: Gastroenterology

## 2012-07-05 VITALS — BP 112/64 | HR 60 | Temp 97.7°F | Resp 18 | Ht 68.0 in | Wt 198.0 lb

## 2012-07-05 DIAGNOSIS — K635 Polyp of colon: Secondary | ICD-10-CM

## 2012-07-05 DIAGNOSIS — D126 Benign neoplasm of colon, unspecified: Secondary | ICD-10-CM

## 2012-07-05 DIAGNOSIS — K921 Melena: Secondary | ICD-10-CM

## 2012-07-05 DIAGNOSIS — K648 Other hemorrhoids: Secondary | ICD-10-CM

## 2012-07-05 MED ORDER — SODIUM CHLORIDE 0.9 % IV SOLN: 500.0000 mL | INTRAVENOUS | Status: DC

## 2012-07-05 NOTE — Op Note (Addendum)
[facility] [address] [place], [address]   COLONOSCOPY PROCEDURE REPORT  PATIENT: Marco Jackson, Marco Jackson  MR#: [id number] BIRTHDATE: [birth year] , 46  yrs. old GENDER: Male ENDOSCOPIST: Meryl Dare, MD, [facility] PROCEDURE DATE:  07/05/2012 PROCEDURE:   Colonoscopy with snare polypectomy and hemorrhoid injection sclerosis ASA CLASS:   Class II INDICATIONS:hematochezia. MEDICATIONS: MAC sedation, administered by CRNA and propofol (Diprivan) 340mg  IV DESCRIPTION OF PROCEDURE:   After the risks benefits and alternatives of the procedure were thoroughly explained, informed consent was obtained.  A digital rectal exam revealed no abnormalities of the rectum.   The LB CF-[id number]  endoscope was introduced through the anus and advanced to the cecum, which was identified by both the appendix and ileocecal valve. No adverse events experienced.   The quality of the prep was excellent, using MoviPrep  The instrument was then slowly withdrawn as the colon was fully examined.  COLON FINDINGS: Two sessile polyps measuring 4-5 mm in size were found in the descending colon and sigmoid colon.  A polypectomy was performed with a cold snare.  The resection was complete and the polyp tissue was completely retrieved.   Large internal hemorrhoids were found. 3 cc of 23.4% saline was injected into internal hemorrhoids well above the dentate line.  The colon was otherwise normal.  There was no diverticulosis, inflammation, polyps or cancers unless previously stated.  Retroflexed views revealed large internal hemorrhoids. The time to cecum=2 minutes 51 seconds. Withdrawal time=14 minutes 04 seconds.  The scope was withdrawn and the procedure completed.  COMPLICATIONS: There were no complications.  ENDOSCOPIC IMPRESSION: 1.   Two sessile polyps measuring 4-5 mm in the descending colon and sigmoid colon; polypectomy performed with a cold snare 2.   Large internal  hemorrhoids  RECOMMENDATIONS: 1.  Hold aspirin, aspirin products, and anti-inflammatory medication for 1 week. 2.  Repeat colonoscopy in 5 years if polyp adenomatous; otherwise 10 years  eSigned:  Meryl Dare, MD, [facility] 07/05/2012 10:05 AM Revised: 07/05/2012 10:05 AM

## 2012-07-05 NOTE — Progress Notes (Signed)
Patient did not experience any of the following events: a burn prior to discharge; a fall within the facility; wrong site/side/patient/procedure/implant event; or a hospital transfer or hospital admission upon discharge from the facility. (G8907) Patient did not have preoperative order for IV antibiotic SSI prophylaxis. (G8918)  

## 2012-07-05 NOTE — Patient Instructions (Addendum)
Impressions/Recommendations:  Polyps (handout given) Internal hemorrhoids (handout given)  Hold aspirin, aspirin products and anti-inflammatory medications for 1 week. May resume 07/13/2012.  Repeat colonoscopy dependent upon pathology results.  YOU HAD AN ENDOSCOPIC PROCEDURE TODAY AT THE [facility]: Refer to the procedure report that was given to you for any specific questions about what was found during the examination.  If the procedure report does not answer your questions, please call your gastroenterologist to clarify.  If you requested that your care partner not be given the details of your procedure findings, then the procedure report has been included in a sealed envelope for you to review at your convenience later.  YOU SHOULD EXPECT: Some feelings of bloating in the abdomen. Passage of more gas than usual.  Walking can help get rid of the air that was put into your GI tract during the procedure and reduce the bloating. If you had a lower endoscopy (such as a colonoscopy or flexible sigmoidoscopy) you may notice spotting of blood in your stool or on the toilet paper. If you underwent a bowel prep for your procedure, then you may not have a normal bowel movement for a few days.  DIET: Your first meal following the procedure should be a light meal and then it is ok to progress to your normal diet.  A half-sandwich or bowl of soup is an example of a good first meal.  Heavy or fried foods are harder to digest and may make you feel nauseous or bloated.  Likewise meals heavy in dairy and vegetables can cause extra gas to form and this can also increase the bloating.  Drink plenty of fluids but you should avoid alcoholic beverages for 24 hours.  ACTIVITY: Your care partner should take you home directly after the procedure.  You should plan to take it easy, moving slowly for the rest of the day.  You can resume normal activity the day after the procedure however you should NOT DRIVE or  use heavy machinery for 24 hours (because of the sedation medicines used during the test).    SYMPTOMS TO REPORT IMMEDIATELY: A gastroenterologist can be reached at any hour.  During normal business hours, 8:30 AM to 5:00 PM Monday through Friday, call [phone].  After hours and on weekends, please call the GI answering service at [phone] who will take a message and have the physician on call contact you.   Following lower endoscopy (colonoscopy or flexible sigmoidoscopy):  Excessive amounts of blood in the stool  Significant tenderness or worsening of abdominal pains  Swelling of the abdomen that is new, acute  Fever of 100F or higher  FOLLOW UP: If any biopsies were taken you will be contacted by phone or by letter within the next 1-3 weeks.  Call your gastroenterologist if you have not heard about the biopsies in 3 weeks.  Our staff will call the home number listed on your records the next business day following your procedure to check on you and address any questions or concerns that you may have at that time regarding the information given to you following your procedure. This is a courtesy call and so if there is no answer at the home number and we have not heard from you through the emergency physician on call, we will assume that you have returned to your regular daily activities without incident.  SIGNATURES/CONFIDENTIALITY: You and/or your care partner have signed paperwork which will be entered into your electronic medical record.  These signatures attest to the fact that that the information above on your After Visit Summary has been reviewed and is understood.  Full responsibility of the confidentiality of this discharge information lies with you and/or your care-partner.  

## 2012-07-05 NOTE — Progress Notes (Signed)
Called to room to assist during endoscopic procedure.  Patient ID and intended procedure confirmed with present staff. Received instructions for my participation in the procedure from the performing physician.  

## 2012-07-06 ENCOUNTER — Telehealth: Payer: Self-pay | Admitting: *Deleted

## 2012-07-06 NOTE — Telephone Encounter (Signed)
  Follow up Call-  Call back number 07/05/2012  Post procedure Call Back phone  # [phone]  Permission to leave phone message Yes     Patient questions:  Do you have a fever, pain , or abdominal swelling? no Pain Score  0 *  Have you tolerated food without any problems? yes  Have you been able to return to your normal activities? yes  Do you have any questions about your discharge instructions: Diet   no Medications  no Follow up visit  no  Do you have questions or concerns about your Care? no  Actions: * If pain score is 4 or above: No action needed, pain <4.

## 2012-07-11 ENCOUNTER — Encounter: Payer: Self-pay | Admitting: Gastroenterology

## 2013-02-07 ENCOUNTER — Other Ambulatory Visit: Payer: Self-pay | Admitting: Neurology

## 2013-02-07 MED ORDER — AMPHETAMINE-DEXTROAMPHETAMINE 10 MG PO TABS: 1.0000 | ORAL_TABLET | Freq: Two times a day (BID) | ORAL | Status: DC

## 2013-02-07 NOTE — Telephone Encounter (Signed)
Rx signed, ready for pick up.  I called the patient.  He is aware.  

## 2013-02-07 NOTE — Telephone Encounter (Signed)
Dr Vickey Huger is out of the office.  Forwarding to Dr Anne Hahn, [facility].

## 2013-02-11 ENCOUNTER — Ambulatory Visit (INDEPENDENT_AMBULATORY_CARE_PROVIDER_SITE_OTHER): Payer: 59 | Admitting: Family Medicine

## 2013-02-11 ENCOUNTER — Encounter: Payer: Self-pay | Admitting: Family Medicine

## 2013-02-11 ENCOUNTER — Telehealth: Payer: Self-pay | Admitting: Family Medicine

## 2013-02-11 VITALS — BP 116/60 | HR 61 | Temp 98.4°F | Wt 198.6 lb

## 2013-02-11 DIAGNOSIS — L247 Irritant contact dermatitis due to plants, except food: Secondary | ICD-10-CM

## 2013-02-11 DIAGNOSIS — L255 Unspecified contact dermatitis due to plants, except food: Secondary | ICD-10-CM

## 2013-02-11 MED ORDER — PREDNISONE 10 MG PO TABS: ORAL_TABLET | ORAL | Status: DC

## 2013-02-11 MED ORDER — METHYLPREDNISOLONE ACETATE 80 MG/ML IJ SUSP
80.0000 mg | Freq: Once | INTRAMUSCULAR | Status: AC
  Administered 2013-02-11: 80 mg via INTRAMUSCULAR

## 2013-02-11 NOTE — Telephone Encounter (Signed)
Note closed due to pt having an appt with provider.

## 2013-02-11 NOTE — Progress Notes (Signed)
  Subjective:     Marco Jackson is a 47 y.o. male who complains of a rash. Symptoms began 3 days ago. Patient describes the rash as red. Characteristics of rash and associated history: Similar rash in the past? yes, Is rash pruritic?  yes, Is rash painful?  no, Alleviating factors? no, Aggravating factors? no, Any associated:none, Skin exposure to potential irritants at home or work or from hobbies?yes-- trimming hedges in back yard. Patient's previous dermatologic history includes contact dermatitis: plants poison ivy. Family history of derm problems: none. Medications currently using: none. Environmental exposures or allergies: poison ivy  The following portions of the patient's history were reviewed and updated as appropriate: allergies, current medications, past family history, past medical history, past social history, past surgical history and problem list.   Review of Systems Pertinent items are noted in HPI.    Objective:    BP 116/60  Pulse 61  Temp(Src) 98.4 F (36.9 C) (Oral)  Wt 198 lb 9.6 oz (90.084 kg)  BMI 30.2 kg/m2  SpO2 96% Physical Exam  General:  alert, cooperative, appears stated age and no distress  HEENT:  PERRLA  Lymph Nodes:  Cervical, supraclavicular, and axillary nodes normal.  Lungs:  clear to auscultation bilaterally  Heart:  S1, S2 normal  Extremities:  extremities normal, atraumatic, no cyanosis or edema  Skin:   Rash located on the R arm--wrist to shoulder   Shape:   raised   Consistency:   soft  Color:   red  Type: vesicle(s)- arm(s) right  Size: na    The remainder of the skin exam:  color normal, no rashes or suspicious lesions, no lesions noted  Laboratory: na      Assessment:    contact dermatitis: plants poison ivy    Plan:    1.  benadryl and steroids: pred taper and depo medrol IM 2.  Written patient instruction given.-- and verbal 3. Follow up as needed for acute illness

## 2013-02-11 NOTE — Telephone Encounter (Signed)
Caller Name: Marco Jackson  Phone: [phone]  Patient: Marco Jackson, Marco Jackson  Gender: Male  DOB: 08-05-1965  Age: 47 Years  PCP: Lelon Perla.   Does the office need to follow up with this patient?: No  RN Note:  history of allergic reaction to same , uses OTC without success   Reason For Call & Symptoms: poison ivy on inside of arm  Reviewed Health History In EMR: Yes  Reviewed Medications In EMR: Yes  Reviewed Allergies In EMR: Yes  Reviewed Surgeries / Procedures: Yes  Date of Onset of Symptoms: 02/09/2013  Guideline(s) Used:  Poison Ivy - [facility] or [facility]  Disposition Per Guideline:  See Today in Office  Reason For Disposition Reached:  Severe itching interferes with normal activities (e.g., work or school) or prevents sleep  Advice Given:  Hydrocortisone Cream for Itching:  Apply 1% hydrocortisone cream 4 times a day to reduce itching. Use it for 5 days.  Keep the cream in the refrigerator (Reason: it feels better if applied cold).  Available over-the-counter in [place] as 0.5% and 1% cream.  Call Back If:  You become worse.  Patient Will Follow Care Advice:  YES  Appointment Scheduled:  02/11/2013 16:15:00; Provider:  Lelon Perla

## 2013-02-11 NOTE — Addendum Note (Signed)
Addended by: Arnette Norris on: 02/11/2013 05:02 PM   Modules accepted: Orders

## 2013-02-11 NOTE — Patient Instructions (Addendum)
Poison Ivy Poison ivy is a inflammation of the skin (contact dermatitis) caused by touching the allergens on the leaves of the ivy plant following previous exposure to the plant. The rash usually appears 48 hours after exposure. The rash is usually bumps (papules) or blisters (vesicles) in a linear pattern. Depending on your own sensitivity, the rash may simply cause redness and itching, or it may also progress to blisters which may break open. These must be well cared for to prevent secondary bacterial (germ) infection, followed by scarring. Keep any open areas dry, clean, dressed, and covered with an antibacterial ointment if needed. The eyes may also get puffy. The puffiness is worst in the morning and gets better as the day progresses. This dermatitis usually heals without scarring, within 2 to 3 weeks without treatment. HOME CARE INSTRUCTIONS  Thoroughly wash with soap and water as soon as you have been exposed to poison ivy. You have about one half hour to remove the plant resin before it will cause the rash. This washing will destroy the oil or antigen on the skin that is causing, or will cause, the rash. Be sure to wash under your fingernails as any plant resin there will continue to spread the rash. Do not rub skin vigorously when washing affected area. Poison ivy cannot spread if no oil from the plant remains on your body. A rash that has progressed to weeping sores will not spread the rash unless you have not washed thoroughly. It is also important to wash any clothes you have been wearing as these may carry active allergens. The rash will return if you wear the unwashed clothing, even several days later. Avoidance of the plant in the future is the best measure. Poison ivy plant can be recognized by the number of leaves. Generally, poison ivy has three leaves with flowering branches on a single stem. Diphenhydramine may be purchased over the counter and used as needed for itching. Do not drive with  this medication if it makes you drowsy.Ask your caregiver about medication for children. SEEK MEDICAL CARE IF:  Open sores develop.  Redness spreads beyond area of rash.  You notice purulent (pus-like) discharge.  You have increased pain.  Other signs of infection develop (such as fever). Document Released: 06/03/2000 Document Revised: 08/29/2011 Document Reviewed: 04/22/2009 Exit[employer] Patient Information 2014 [employer].  

## 2013-06-11 ENCOUNTER — Other Ambulatory Visit: Payer: Self-pay | Admitting: Neurology

## 2013-06-11 ENCOUNTER — Telehealth: Payer: Self-pay | Admitting: Neurology

## 2013-06-11 MED ORDER — AMPHETAMINE-DEXTROAMPHETAMINE 10 MG PO TABS: 10.0000 mg | ORAL_TABLET | Freq: Two times a day (BID) | ORAL | Status: DC

## 2013-06-11 NOTE — Telephone Encounter (Signed)
Needs adderall RX never picked up the last one.

## 2013-06-12 NOTE — Telephone Encounter (Signed)
Called patient to pick up mediation. I also discussed that his Rx can only be filled one month at a time. His next OV is December 18 2013

## 2013-12-16 ENCOUNTER — Encounter: Payer: Self-pay | Admitting: *Deleted

## 2013-12-18 ENCOUNTER — Encounter: Payer: Self-pay | Admitting: Neurology

## 2013-12-18 ENCOUNTER — Encounter (INDEPENDENT_AMBULATORY_CARE_PROVIDER_SITE_OTHER): Payer: Self-pay

## 2013-12-18 ENCOUNTER — Ambulatory Visit (INDEPENDENT_AMBULATORY_CARE_PROVIDER_SITE_OTHER): Payer: 59 | Admitting: Neurology

## 2013-12-18 VITALS — BP 112/65 | HR 57 | Resp 16 | Ht 69.0 in | Wt 185.0 lb

## 2013-12-18 DIAGNOSIS — G4710 Hypersomnia, unspecified: Secondary | ICD-10-CM

## 2013-12-18 DIAGNOSIS — G47411 Narcolepsy with cataplexy: Secondary | ICD-10-CM

## 2013-12-18 HISTORY — DX: Hypersomnia, unspecified: G47.10

## 2013-12-18 MED ORDER — IMIPRAMINE HCL 25 MG PO TABS: 25.0000 mg | ORAL_TABLET | Freq: Two times a day (BID) | ORAL | Status: DC

## 2013-12-18 MED ORDER — AMPHETAMINE-DEXTROAMPHETAMINE 10 MG PO TABS: 10.0000 mg | ORAL_TABLET | Freq: Two times a day (BID) | ORAL | Status: DC

## 2013-12-18 NOTE — Progress Notes (Signed)
[facility]  Provider:  Larey Seat, M D  Referring Provider: Rosalita Chessman, DO Primary Care Physician:  Garnet Koyanagi, DO  Chief Complaint  Patient presents with  . Follow-up    Room 11  . narcolepsy    HPI:  Marco Jackson is a 48 y.o. caucasian, adopted  male , who  Is seen here as a revisit for narcolepsy.  The patient was originally referred by Dr. Durward Fortes for a diagnostic sleep study on 1-18-also had no significant apnea at the time with an AHI of 2.1 and had an MS LT to follow. The MSLT  documented  2 SREM  In 4 naps., mean sleep latency of  4.8 minutes , REM latency of  6.8 minutes.   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  He had this feeling of sinking when he played a joke on his playmates in child hood .  He remains on Adderall .   We have discussed XYREM many times before but he has irregular hours and felt he couldn't commit to a routine intake time.  He has been taking Adderall bid po.   His father and grandmother have passed away, and he still has to deal with the estate.   Review of Systems: Out of a complete 14 system review, the patient complains of only the following symptoms, and all other reviewed systems are negative. Epworth 10 , FSS 23 .  GDS 3 points.   History   Social History  . Marital Status: Married    Spouse Name: [name]    Number of Children: 1  . Years of Education: 11   Occupational History  . Truck Geophysicist/field seismologist    Social History Main Topics  . Smoking status: Current Every Day Smoker -- 1.00 packs/day for 60 years  . Smokeless tobacco: Never Used     Comment: used chantix before   . Alcohol Use: Yes     Comment: very little  . Drug Use: No  . Sexual Activity: Yes    Partners: Female   Other Topics Concern  . Not on file   Social History Narrative   Patient is married [name]) and lives at home with his wife.   Patient is a truck Therapist, sports.   Patient has an 11th grade education.   Patient is right-handed.   Patient drinks 4 caffeine drinks per day.    Family History  Problem Relation Age of Onset  . Adopted: Yes    Past Medical History  Diagnosis Date  . Asthma   . Narcolepsy and cataplexy   . ED (erectile dysfunction)   . DDD (degenerative disc disease)     C4- pinched nerve  . Hypersomnia, persistent 12/18/2013    Past Surgical History  Procedure Laterality Date  . Sterilization    . Cholecystectomy    . Hemorrhoidectomy    . Patient states he has not had a vasectomy      Current Outpatient Prescriptions  Medication Sig Dispense Refill  . amphetamine-dextroamphetamine (ADDERALL) 10 MG tablet Take 1 tablet (10 mg total) by mouth 2 (two) times daily.  180 tablet  0   No current facility-administered medications for this visit.    Allergies as of 12/18/2013  . (No Known Allergies)    Vitals: BP 112/65  Pulse 57  Resp 16  Ht 5\' 9"  (1.753 m)  Wt 185  lb (83.915 kg)  BMI 27.31 kg/m2 Last Weight:  Wt Readings from Last 1 Encounters:  12/18/13 185 lb (83.915 kg)   Last Height:   Ht Readings from Last 1 Encounters:  12/18/13 5\' 9"  (1.753 m)    Physical exam:  General: The patient is awake, alert and appears not in acute distress. The patient is well groomed. Head: Normocephalic, atraumatic. Neck is supple. Mallampati 2 , neck circumference: 15 inches . He has recently dental work, implants and surgery to the sinus.  Cardiovascular:  Regular rate and rhythm, without  murmurs or carotid bruit, and without distended neck veins. Respiratory: Lungs ; rhonchi, no bronchitis.   Active smoker. 25 years  Skin:  Without evidence of edema, or rash Trunk: BMI is normal .  Neurologic exam : The patient is awake and alert, oriented to place and time.  Memory subjective  described as intact. There is a normal attention span & concentration ability.  Speech is fluent without dysarthria,  dysphonia or aphasia. Mood and affect are appropriate.  Cranial nerves: Pupils are equal and briskly reactive to light. Funduscopic exam without evidence of pallor or edema. Extraocular movements  in vertical and horizontal planes intact and without nystagmus. Visual fields by finger perimetry are intact. Hearing to finger rub intact.   Facial sensation intact to fine touch. Facial motor strength is symmetric and tongue and uvula move midline.  Motor exam:   Normal tone , muscle bulk and symmetric normal strength in all extremities.  Sensory:  Fine touch, pinprick and vibration were tested in all extremities. Proprioception ; normal.  Coordination: Rapid alternating movements in the fingers/hands is tested and normal. Finger-to-nose maneuver tested and normal without evidence of ataxia, dysmetria or tremor.  Gait and station: Patient walks without assistive device . Deep tendon reflexes: in the  upper and lower extremities are symmetric and intact. Babinski maneuver response is  downgoing.   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  3) forgetfulness memory problems.   Plan:  Treatment plan and additional workup :  1) Adderall refilled, I still would prefer XYREM. He has cut out SODA. He can use imipramine for cataplexy suppression .

## 2013-12-18 NOTE — Patient Instructions (Signed)
Narcolepsy Narcolepsy is a disabling neurological disorder of sleep regulation. It affects the control of sleep. It also affects the control of wakefulness. It is an interruption of the dreaming state of sleep. This state is known as REM or rapid eye movement sleep.  SYMPTOMS  The development, number, and severity of symptoms vary widely among people with the disorder. Symptoms generally begin between the ages of 67 and 84. The four classic symptoms of the disorder are:   Excessive daytime sleepiness.  Cataplexy. This is sudden, brief episodes of muscle weakness or paralysis. It is caused by strong emotions. Common strong emotions are laughter, anger, surprise, or anticipation.  Sleep paralysis. This is paralysis upon falling asleep or waking up.  Hallucinations. These are vivid dream-like images that occur at when you first fall asleep. Other symptoms include:   Unrelenting excessive sleepiness. This is usually the first and most obvious symptom.  Sleep attacks. Patients have strong sleep attacks throughout the day. These attacks can last for 30 seconds to more than 30 minutes. These happen no matter how much or how well the person slept the night before. These attacks end up making the person sleep at work and social events. The person can fall asleep while eating, talking, and driving. They also fall asleep at other out of place times.  Disturbed nighttime sleep.  Tossing and turning in bed.  Leg jerks.  Nightmares.  Waking up often. DIAGNOSIS  It's possible that genetics play a role in this disorder. Narcolepsy is not a rare disorder. It is often misdiagnosed. It is often diagnosed years after symptoms first appear. Early diagnosis and treatment are important. This help the physical and mental well-being of the patient. TREATMENT  There is no cure for narcolepsy. The symptoms can be controlled with behavioral and medical therapy. The excessive daytime sleepiness may be treated with  stimulant drugs. It may also be treated with the drug modafinil (Provigil). Cataplexy and other REM-sleep symptoms may be treated with antidepressant medications. Medications will reduce the symptoms. Medications will not ease symptoms entirely. Many available medications have side effects. Basic lifestyle changes may also reduce the symptoms. These changes include having regular sleep schedules and scheduled daytime naps. Other lifestyle changes include avoiding "over-stimulating" situations. Document Released: 05/27/2002 Document Revised: 08/29/2011 Document Reviewed: 05/29/2013 [facility] Patient Information 2015 [place], [place]. This information is not intended to replace advice given to you by your health care provider. Make sure you discuss any questions you have with your health care provider.

## 2013-12-19 LAB — COMPREHENSIVE METABOLIC PANEL
ALK PHOS: 66 IU/L (ref 39–117)
ALT: 20 IU/L (ref 0–44)
AST: 16 IU/L (ref 0–40)
Albumin/Globulin Ratio: 2 (ref 1.1–2.5)
Albumin: 4.5 g/dL (ref 3.5–5.5)
BUN/Creatinine Ratio: 17 (ref 9–20)
BUN: 13 mg/dL (ref 6–24)
CHLORIDE: 100 mmol/L (ref 97–108)
CO2: 26 mmol/L (ref 18–29)
Calcium: 9.8 mg/dL (ref 8.7–10.2)
Creatinine, Ser: 0.78 mg/dL (ref 0.76–1.27)
GFR calc non Af Amer: 107 mL/min/{1.73_m2} (ref >59)
GFR, EST AFRICAN AMERICAN: 124 mL/min/{1.73_m2} (ref >59)
GLUCOSE: 73 mg/dL (ref 65–99)
Globulin, Total: 2.3 g/dL (ref 1.5–4.5)
POTASSIUM: 4.6 mmol/L (ref 3.5–5.2)
Sodium: 140 mmol/L (ref 134–144)
TOTAL PROTEIN: 6.8 g/dL (ref 6.0–8.5)

## 2013-12-25 ENCOUNTER — Telehealth: Payer: Self-pay | Admitting: Neurology

## 2013-12-25 NOTE — Telephone Encounter (Signed)
Patient calling to inform Dr. Brett Fairy, insurance will only pay for a 30 day supply of amphetamine-dextroamphetamine (ADDERALL) 10 MG tablet at a time.  He picked up 60 tabs from pharmacy and will need another Rx in 30 days for next 60 tabs and so forth in the prior months to come.Marland KitchenFYI

## 2013-12-25 NOTE — Progress Notes (Signed)
Quick Note:  Shared lab results with and he verbalized understanding ______

## 2013-12-25 NOTE — Telephone Encounter (Signed)
Noted.  We can gladly write the next Rx for 30 days rather than 90 day days when it's due for refill.

## 2014-02-04 ENCOUNTER — Telehealth: Payer: Self-pay | Admitting: Neurology

## 2014-02-04 ENCOUNTER — Other Ambulatory Visit: Payer: Self-pay

## 2014-02-04 DIAGNOSIS — G4710 Hypersomnia, unspecified: Secondary | ICD-10-CM

## 2014-02-04 DIAGNOSIS — G47411 Narcolepsy with cataplexy: Secondary | ICD-10-CM

## 2014-02-04 MED ORDER — AMPHETAMINE-DEXTROAMPHETAMINE 10 MG PO TABS: 10.0000 mg | ORAL_TABLET | Freq: Two times a day (BID) | ORAL | Status: DC

## 2014-02-04 NOTE — Telephone Encounter (Signed)
Patient requesting Rx refill for amphetamine-dextroamphetamine (ADDERALL) 10 MG tablet.  Requesting we mail Rx to his home.  Please call and advise anytime, and may leave message if not available.  Thanks

## 2014-02-05 NOTE — Telephone Encounter (Signed)
Spoke to pt and will mail script to him.

## 2014-04-23 ENCOUNTER — Other Ambulatory Visit: Payer: Self-pay | Admitting: Neurology

## 2014-04-23 DIAGNOSIS — G47411 Narcolepsy with cataplexy: Secondary | ICD-10-CM

## 2014-04-23 DIAGNOSIS — G4710 Hypersomnia, unspecified: Secondary | ICD-10-CM

## 2014-04-23 MED ORDER — AMPHETAMINE-DEXTROAMPHETAMINE 10 MG PO TABS: 10.0000 mg | ORAL_TABLET | Freq: Two times a day (BID) | ORAL | Status: DC

## 2014-04-23 NOTE — Telephone Encounter (Signed)
Request entered, forwarded to provider for approval.  

## 2014-04-23 NOTE — Telephone Encounter (Signed)
Patient requesting Rx refill for amphetamine-dextroamphetamine (ADDERALL) 10 MG tablet.  Please call when ready for pick up.

## 2014-04-24 NOTE — Telephone Encounter (Signed)
I called the patient to let them know their Rx for Adderall was ready for pickup. Patient was instructed to bring Photo ID. 

## 2014-05-27 ENCOUNTER — Telehealth: Payer: Self-pay | Admitting: Neurology

## 2014-05-27 NOTE — Telephone Encounter (Signed)
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 Brett Fairy to fax something explaining his sleep disorder.  Please call patient and advise.

## 2014-05-29 ENCOUNTER — Encounter: Payer: Self-pay | Admitting: *Deleted

## 2014-05-29 NOTE — Telephone Encounter (Signed)
letter needs to say established patient with regular neurological follow up, dx : " Daytime hypersomnia controlled with medication " .   [phone]  Fax to  [facility] family [facility].

## 2014-05-30 NOTE — Telephone Encounter (Signed)
Redid as per request.

## 2014-05-30 NOTE — Telephone Encounter (Signed)
Patient is calling stating [facility] did not receive the fax. Please refax to [phone]. Thank you.

## 2014-05-30 NOTE — Telephone Encounter (Signed)
I faxed note to [facility] [phone] with success.  [phone].  Pt informed.

## 2014-06-24 ENCOUNTER — Ambulatory Visit (INDEPENDENT_AMBULATORY_CARE_PROVIDER_SITE_OTHER): Payer: 59 | Admitting: Adult Health

## 2014-06-24 ENCOUNTER — Encounter: Payer: Self-pay | Admitting: Adult Health

## 2014-06-24 VITALS — BP 130/81 | HR 66 | Ht 66.0 in | Wt 182.4 lb

## 2014-06-24 DIAGNOSIS — G47411 Narcolepsy with cataplexy: Secondary | ICD-10-CM

## 2014-06-24 DIAGNOSIS — G4710 Hypersomnia, unspecified: Secondary | ICD-10-CM

## 2014-06-24 MED ORDER — AMPHETAMINE-DEXTROAMPHETAMINE 10 MG PO TABS: 10.0000 mg | ORAL_TABLET | Freq: Two times a day (BID) | ORAL | Status: DC

## 2014-06-24 NOTE — Patient Instructions (Signed)
Continue taking Adderall 10 mg twice a day.  If you have increase episodes of cataplexy please let us know.  If your daytime sleepiness worsen let us know as well.

## 2014-06-24 NOTE — Progress Notes (Signed)
I agree with the assessment and plan as directed by NP .The patient is known to me .   Sharyl Panchal, MD  

## 2014-06-24 NOTE — Progress Notes (Signed)
PATIENT: Marco Jackson DOB: 07-28-1965  REASON FOR VISIT: follow up HISTORY FROM: patient  HISTORY OF PRESENT ILLNESS: Mr. 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 He returns today for follow-up. He is currently taking Adderall BID. He reports that this is working well for him. He has not used the imipramine at all. He stopped taking this because he felt that it made him angry. He states that he has not had any cataplexy events recently but when he does it is usually when he plays a joke on someone.  He states that as long as he uses adderall he is able to drive. He denies falling asleep while driving. He goes to bed around 9PM-10PM and arises at 5-6 AM. He gets up approximately 2 times to urinate. No new medical issues since last seen. He did have oral surgery since last seen- he had implants put in.   HISTORY 12/18/13 (D[place]meier): Marco Jackson is a 49 y.o. caucasian, adopted  male , who  Is seen here as a revisit for narcolepsy.The patient was originally referred by Dr. Durward Fortes for a diagnostic sleep study on 1-18-also had no significant apnea at the time with an AHI of 2.1 and had an MS LT to follow. The MSLT  documented  2 SREM  In 4 naps., mean sleep latency of  4.8 minutes , REM latency of  6.8 minutes. 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  He had this feeling of sinking when he played a joke on his playmates in child hood . He remains on Adderall .  We have discussed XYREM many times before but he has irregular hours and felt he couldn't commit to a routine intake time.  He has been taking Adderall bid po. His father and grandmother have passed away, and he still has to deal with the estate.  REVIEW OF SYSTEMS: Out of a complete 14 system review of symptoms, the patient complains only of the following symptoms, and all other reviewed systems are negative.  Loss if vision, excessive  thrist  ALLERGIES: No Known Allergies  HOME MEDICATIONS: Outpatient Prescriptions Prior to Visit  Medication Sig Dispense Refill  . amphetamine-dextroamphetamine (ADDERALL) 10 MG tablet Take 1 tablet (10 mg total) by mouth 2 (two) times daily. 60 tablet 0  . imipramine (TOFRANIL) 25 MG tablet Take 1 tablet (25 mg total) by mouth 2 (two) times daily. 60 tablet 6   No facility-administered medications prior to visit.    PAST MEDICAL HISTORY: Past Medical History  Diagnosis Date  . Asthma   . Narcolepsy and cataplexy   . ED (erectile dysfunction)   . DDD (degenerative disc disease)     C4- pinched nerve  . Hypersomnia, persistent 12/18/2013    PAST SURGICAL HISTORY: Past Surgical History  Procedure Laterality Date  . Sterilization    . Cholecystectomy    . Hemorrhoidectomy    . Patient states he has not had a vasectomy      FAMILY HISTORY: Family History  Problem Relation Age of Onset  . Adopted: Yes    SOCIAL HISTORY: History   Social History  . Marital Status: Married    Spouse Name: [name]    Number of Children: 1  . Years of Education: 11   Occupational History  . Truck Geophysicist/field seismologist    Social History Main Topics  . Smoking status: Current Every  Day Smoker -- 1.00 packs/day for 60 years  . Smokeless tobacco: Never Used     Comment: used chantix before   . Alcohol Use: Yes     Comment: very little  . Drug Use: No  . Sexual Activity:    Partners: Female   Other Topics Concern  . Not on file   Social History Narrative   Patient is married [name]) and lives at home with his wife.   Patient is a truck Sport and exercise psychologist.   Patient has an 11th grade education.   Patient is right-handed.   Patient drinks 4 caffeine drinks per day.      PHYSICAL EXAM  Filed Vitals:   06/24/14 0958  BP: 130/81  Pulse: 66  Height: 5\' 6"  (1.676 m)  Weight: 182 lb 6.4 oz (82.736 kg)   Body mass index is 29.45 kg/(m^2).  Generalized: Well developed, in no  acute distress   Neurological examination  Mentation: Alert oriented to time, place, history taking. Follows all commands speech and language fluent Cranial nerve II-XII: Pupils were equal round reactive to light. Extraocular movements were full, visual field were full on confrontational test. Facial sensation and strength were normal. Uvula tongue midline. Head turning and shoulder shrug  were normal and symmetric. Motor: The motor testing reveals 5 over 5 strength of all 4 extremities. Good symmetric motor tone is noted throughout.  Sensory: Sensory testing is intact to soft touch on all 4 extremities. No evidence of extinction is noted.  Coordination: Cerebellar testing reveals good finger-nose-finger and heel-to-shin bilaterally.  Gait and station: Gait is normal. Tandem gait is normal. Romberg is negative. No drift is seen.  Reflexes: Deep tendon reflexes are symmetric and normal bilaterally.    DIAGNOSTIC DATA (LABS, IMAGING, TESTING) - I reviewed patient records, labs, notes, testing and imaging myself where available.  Lab Results  Component Value Date   WBC 8.7 04/24/2012   HGB 15.2 04/24/2012   HCT 46.4 04/24/2012   MCV 96.5 04/24/2012   PLT 260.0 04/24/2012      Component Value Date/Time   NA 140 12/18/2013 1545   NA 138 04/24/2012 1149   K 4.6 12/18/2013 1545   CL 100 12/18/2013 1545   CO2 26 12/18/2013 1545   GLUCOSE 73 12/18/2013 1545   GLUCOSE 90 04/24/2012 1149   BUN 13 12/18/2013 1545   BUN 12 04/24/2012 1149   CREATININE 0.78 12/18/2013 1545   CALCIUM 9.8 12/18/2013 1545   PROT 6.8 12/18/2013 1545   PROT 7.5 04/24/2012 1149   ALBUMIN 4.7 04/24/2012 1149   AST 16 12/18/2013 1545   ALT 20 12/18/2013 1545   ALKPHOS 66 12/18/2013 1545   BILITOT <0.2 12/18/2013 1545   GFRNONAA 107 12/18/2013 1545   GFRAA 124 12/18/2013 1545   Lab Results  Component Value Date   CHOL 201* 03/22/2010   HDL 41.90 03/22/2010   LDLDIRECT 154.4 03/22/2010   TRIG 102.0  03/22/2010   CHOLHDL 5 03/22/2010      ASSESSMENT AND PLAN 49 y.o. year old male  has a past medical history of Asthma; Narcolepsy and cataplexy; ED (erectile dysfunction); DDD (degenerative disc disease); and Hypersomnia, persistent (12/18/2013). here with:  1. Narcolepsy with cataplexy 2. Hypersomnia  Overall patient is doing well.  Adderall controls his daytime sleepiness. I will refill today.  Patient could not tolerate Imipramine due to side effects. I will D/C.  If his daytime sleepiness worsens or cataplexy events increase he should let us know.  Follow-up in 6 months or sooner if needed.      Ward Givens, MSN, NP-C 06/24/2014, 10:30 AM [facility] [address] [place], [place] [address] [phone]  Note: This document was prepared with digital dictation and possible smart phrase technology. Any transcriptional errors that result from this process are unintentional.

## 2015-01-05 ENCOUNTER — Ambulatory Visit (INDEPENDENT_AMBULATORY_CARE_PROVIDER_SITE_OTHER): Payer: 59 | Admitting: Adult Health

## 2015-01-05 ENCOUNTER — Encounter: Payer: Self-pay | Admitting: Adult Health

## 2015-01-05 VITALS — BP 127/77 | HR 64 | Ht 66.0 in | Wt 175.0 lb

## 2015-01-05 DIAGNOSIS — G47411 Narcolepsy with cataplexy: Secondary | ICD-10-CM

## 2015-01-05 MED ORDER — AMPHETAMINE-DEXTROAMPHETAMINE 10 MG PO TABS: 10.0000 mg | ORAL_TABLET | Freq: Two times a day (BID) | ORAL | Status: DC

## 2015-01-05 NOTE — Progress Notes (Signed)
I agree with the assessment and plan as directed by NP .The patient is known to me .   Veleka Djordjevic, MD  

## 2015-01-05 NOTE — Progress Notes (Signed)
PATIENT: Marco Jackson DOB: [birth year]  REASON FOR VISIT: follow up-narcolepsy HISTORY FROM: patient  HISTORY OF PRESENT ILLNESS: Marco Jackson is a 49 year old male with a history of narcolepsy. He returns today for follow-up. The patient is currently taking Adderall 10 mg twice a day. He reports that he recently has not been taking it consistently only that he's been unable to come pick up his prescription. 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 He states that when he does take it it works well. Denies falling asleep while driving. He states that his bedtime varies due to his job. That he typically gets up to 3 times a night to urinate. Epworth score is 10 and fatigue severity score is 22. Denies any cataplectic events. He denies any new medical issues. He returns today for an evaluation.  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 He returns today for follow-up. He is currently taking Adderall BID. He reports that this is working well for him. He has not used the imipramine at all. He stopped taking this because he felt that it made him angry. He states that he has not had any cataplexy events recently but when he does it is usually when he plays a joke on someone. He states that as long as he uses adderall he is able to drive. He denies falling asleep while driving. He goes to bed around 9PM-10PM and arises at 5-6 AM. He gets up approximately 2 times to urinate. No new medical issues since last seen. He did have oral surgery since last seen- he had implants put in.   REVIEW OF SYSTEMS: Out of a complete 14 system review of symptoms, the patient complains only of the following symptoms, and all other reviewed systems are negative.  See history of present illness    ALLERGIES: No Known Allergies  HOME MEDICATIONS: Outpatient Prescriptions Prior to Visit  Medication Sig Dispense Refill  . amphetamine-dextroamphetamine (ADDERALL) 10  MG tablet Take 1 tablet (10 mg total) by mouth 2 (two) times daily. 60 tablet 0   No facility-administered medications prior to visit.    PAST MEDICAL HISTORY: Past Medical History  Diagnosis Date  . Asthma   . Narcolepsy and cataplexy   . ED (erectile dysfunction)   . DDD (degenerative disc disease)     C4- pinched nerve  . Hypersomnia, persistent 12/18/2013    PAST SURGICAL HISTORY: Past Surgical History  Procedure Laterality Date  . Sterilization    . Cholecystectomy    . Hemorrhoidectomy    . Patient states he has not had a vasectomy      FAMILY HISTORY: Family History  Problem Relation Age of Onset  . Adopted: Yes    SOCIAL HISTORY: History   Social History  . Marital Status: Married    Spouse Name: [name]  . Number of Children: 1  . Years of Education: 11   Occupational History  . Truck Geophysicist/field seismologist    Social History Main Topics  . Smoking status: Current Every Day Smoker -- 1.00 packs/day for 60 years  . Smokeless tobacco: Never Used     Comment: used chantix before   . Alcohol Use: Yes     Comment: very little  . Drug Use: No  . Sexual Activity:    Partners: Female   Other Topics Concern  . Not on file   Social History Narrative   Patient is  married [name]) and lives at home with his wife.   Patient is a truck Sport and exercise psychologist.   Patient has an 11th grade education.   Patient is right-handed.   Patient drinks 4 caffeine drinks per day.      PHYSICAL EXAM  Filed Vitals:   01/05/15 1039  BP: 127/77  Pulse: 64  Height: 5\' 6"  (1.676 m)  Weight: 175 lb (79.379 kg)   Body mass index is 28.26 kg/(m^2).  Generalized: Well developed, in no acute distress   Neurological examination  Mentation: Alert oriented to time, place, history taking. Follows all commands speech and language fluent Cranial nerve II-XII: Pupils were equal round reactive to light. Extraocular movements were full, visual field were full on confrontational test.  Facial sensation and strength were normal. Uvula tongue midline. Head turning and shoulder shrug  were normal and symmetric. Motor: The motor testing reveals 5 over 5 strength of all 4 extremities. Good symmetric motor tone is noted throughout.  Sensory: Sensory testing is intact to soft touch on all 4 extremities. No evidence of extinction is noted.  Coordination: Cerebellar testing reveals good finger-nose-finger and heel-to-shin bilaterally.  Gait and station: Gait is normal. Tandem gait is normal. Romberg is negative. No drift is seen.  Reflexes: Deep tendon reflexes are symmetric and normal bilaterally.   DIAGNOSTIC DATA (LABS, IMAGING, TESTING) - I reviewed patient records, labs, notes, testing and imaging myself where available.   ASSESSMENT AND PLAN 49 y.o. year old male  has a past medical history of Asthma; Narcolepsy and cataplexy; ED (erectile dysfunction); DDD (degenerative disc disease); and Hypersomnia, persistent (12/18/2013). here with:  1. Narcolepsy  Overall the patient is doing well. He'll continue Adderall 2 mg twice a day. Refill given to patient today. Patient advised that if his symptoms worsen or he develops new symptoms he should let us know. Follow-up in 6 months or sooner if needed.     Ward Givens, MSN, NP-C 01/05/2015, 11:02 AM [facility] [address] [place], [place] [address] [phone]  Note: This document was prepared with digital dictation and possible smart phrase technology. Any transcriptional errors that result from this process are unintentional.

## 2015-01-05 NOTE — Patient Instructions (Signed)
Continue Adderall 10 mg twice a day If your symptoms worsen or you develop new symptoms please let us know.

## 2015-07-08 ENCOUNTER — Ambulatory Visit: Payer: 59 | Admitting: Adult Health

## 2015-07-09 ENCOUNTER — Encounter: Payer: Self-pay | Admitting: Adult Health

## 2016-04-25 ENCOUNTER — Ambulatory Visit (HOSPITAL_COMMUNITY)
Admission: EM | Admit: 2016-04-25 | Discharge: 2016-04-25 | Disposition: A | Discharge status: Home / Self Care | Payer: Self-pay

## 2016-05-02 ENCOUNTER — Other Ambulatory Visit: Payer: Self-pay | Admitting: Orthopedic Surgery

## 2016-05-02 DIAGNOSIS — M25512 Pain in left shoulder: Secondary | ICD-10-CM

## 2016-05-08 ENCOUNTER — Ambulatory Visit
Admission: RE | Admit: 2016-05-08 | Discharge: 2016-05-08 | Disposition: A | Discharge status: Home / Self Care | Payer: BLUE CROSS/BLUE SHIELD | Source: Ambulatory Visit | Attending: Orthopedic Surgery | Admitting: Orthopedic Surgery

## 2016-05-08 DIAGNOSIS — M25512 Pain in left shoulder: Secondary | ICD-10-CM

## 2016-05-14 ENCOUNTER — Other Ambulatory Visit: Payer: Self-pay

## 2017-04-25 ENCOUNTER — Ambulatory Visit: Payer: Self-pay | Admitting: Neurology

## 2017-04-25 ENCOUNTER — Encounter: Payer: Self-pay | Admitting: Neurology

## 2017-04-25 VITALS — BP 101/62 | HR 54 | Ht 66.0 in | Wt 179.0 lb

## 2017-04-25 DIAGNOSIS — Z720 Tobacco use: Secondary | ICD-10-CM

## 2017-04-25 DIAGNOSIS — F1510 Other stimulant abuse, uncomplicated: Secondary | ICD-10-CM

## 2017-04-25 DIAGNOSIS — G47411 Narcolepsy with cataplexy: Secondary | ICD-10-CM

## 2017-04-25 MED ORDER — AMPHETAMINE-DEXTROAMPHETAMINE 10 MG PO TABS: 10.0000 mg | ORAL_TABLET | Freq: Every day | ORAL | 0 refills | Status: DC

## 2017-04-25 NOTE — Progress Notes (Signed)
SLEEP MED[facility]INE CLINIC   Provider:  Larey Seat, [place] D  Primary Care Physician:  Carollee Herter, Alferd Apa, DO   Referring Provider:    Chief Complaint  Patient presents with  . Follow-up    pt with wife, pt is back to driving and he is needing the medication to be able to do this. pt is here today to start treatment for his narcolepsy.     HPI:  Marco Jackson is a 51 y.o. male , seen here as in a referral to re-instate care for narcolepsy.  I have seen Mr. Mano regularly until 12/2013, over 3 years ago.  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  He is currently without health insurance coverage.  He is planning to return to driving and would need medication to allow him to do so safely.  He endorsed the fatigue severity scale today at 49 point and the Epworth sleepiness score at 19 out of 24 points.  The patient was originally referred by Dr. Durward Fortes for diagnostic sleep study after he had revealed to be excessively daytime sleepy and to consume a lot of caffeine to help him stay awake in daytime.  He was  placed on Adderall.  The hypersomnia persisted.   In addition he has asthma, continued nicotine use, ED, and degenerative disc disease with a pinched nerve at the fourth cervical level- he also developed left leg sciatica,and numbness.    He was diagnosed with narcolepsy and cataplexy in 2008, his original polysomnography showed an AHI of only 2.1 and no other physiologic sleep disorders and was valid for an M SLT to follow.  The M SLT showed 2 sleep onset REM in 4 naps with a mean sleep latency of only 4.8 minutes, REM latency of 6.8 minutes.  The patient reported clinically hypersomnia, sleep attacks and cataplexy.  His knees buckled when he laughs, sometimes when he is startled or when he is angry he felt a sinking feeling. This has continued.   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  He continues to drink caffeine to help him throughout the day with excessive daytime sleepiness.  The Epworth and fatigue severity score below.    Sleep habits are as follows: He usually goes to bed between 830 and 9:30 PM and is usually asleep minutes.  He sleeps through until about midnight will have one bathroom break, will return back to sleep and may have another couple of bathroom breaks later in the morning.  He rises at 4:30 AM.  He reports that he feels diaphoretic and sometimes clammy, sometimes with palpitations.  Does not wake up with headaches but with a very dry mouth. He gets 7 hours or more of nocturnal sleep- He still struggles to not nap in daytime. He has a lot of nap attacks.   Sleep medical history and family sleep history: The patient has been sleepier than his peers all his life, he was adopted there is no biological family history of sleep disorders available. 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  Continues 1 pp d smoking for  30 years, has been using 4-6 caffeinated beverages, mountain dew. Alcohol use, rarely.    Review of Systems: Out of a complete 14 system review, the patient complains of only the following symptoms, and all other reviewed systems are negative.  EDS- vivid dreams, cataplectic attacks.   Epworth score  19 , Fatigue severity score 49  , depression score 1/ 15    Social History   Socioeconomic History  . Marital status: Married    Spouse name: Marco Jackson  . Number of children: 1  . Years of education: 63  . Highest education level: Not on file  Social Needs  . Financial resource strain: Not on file  . Food insecurity - worry: Not on file  . Food insecurity - inability: Not on file    . Transportation needs - medical: Not on file  . Transportation needs - non-medical: Not on file  Occupational History  . Occupation: Truck Education administrator: [employer]  Tobacco Use  . Smoking status: Current Every Day Smoker    Packs/day: 1.00    Years: 60.00    Pack years: 60.00  . Smokeless tobacco: Never Used  . Tobacco comment: used chantix before   Substance and Sexual Activity  . Alcohol use: Yes    Comment: very little  . Drug use: No  . Sexual activity: Yes    Partners: Female  Other Topics Concern  . Not on file  Social History Narrative   Patient is married Marco Jackson) and lives at home with his wife.   Patient is a truck Sport and exercise psychologist.   Patient has an 11th grade education.   Patient is right-handed.   Patient drinks 4 caffeine drinks per day.    Family History  Adopted: Yes    Past Medical History:  Diagnosis Date  . Asthma   . DDD (degenerative disc disease)    C4- pinched nerve  . ED (erectile dysfunction)   . 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 Narcolepsy and cataplexy     Past Surgical History:  Procedure Laterality Date  . CHOLECYSTECTOMY    . hemorrhoidectomy    . PATIENT STATES HE HAS NOT HAD A VASECTOMY    . STERILIZATION      No current outpatient medications on file.   No current facility-administered medications for this visit.     Allergies as of 04/25/2017  . (No Known Allergies)    Vitals: BP 101/62   Pulse (!) 54   Ht 5\' 6"  (1.676 m)   Wt 179 lb (81.2 kg)   BMI 28.89 kg/m  Last Weight:  Wt Readings from Last 1 Encounters:  04/25/17 179 lb (81.2 kg)   KGU:RKYH mass index is 28.89 kg/m.     Last Height:   Ht Readings from Last 1 Encounters:  04/25/17 5\' 6"  (1.676 m)    Physical exam:  General: The patient is awake, alert and appears not in acute distress. The patient is well groomed. Head: Normocephalic, atraumatic. Neck is supple. Mallampati 2,  neck circumference:16. 25 . Nasal airflow  patent ,  Retrognathia is seen.  Cardiovascular:  Regular rate and rhythm, without  murmurs or carotid bruit, and without distended neck veins. Respiratory: Lungs are wheezing to  auscultation. Skin:  Without evidence of edema, or rash Trunk: BMI is 29  Neurologic exam : The patient is awake and alert, oriented to place and time.    Attention span & concentration ability appears normal.  Speech is fluent,  without dysarthria, dysphonia or aphasia.  Mood and affect are appropriate.  Cranial nerves: Pupils are equal and briskly reactive to light.  Extraocular movements  in vertical and horizontal planes intact and without nystagmus. Visual fields by finger perimetry are intact.Hearing to finger rub intact.  Facial sensation intact to fine touch. Facial motor strength is symmetric and tongue and uvula move midline. Shoulder shrug was symmetrical.   Motor exam:  Normal tone, muscle bulk and symmetric strength in all extremities. Sensory:  Fine touch, pinprick and vibration were  Normal. No residual numbness in left leg.  Coordination: Rapid alternating movements in the fingers/hands are normal. Finger-to-nose maneuver  without evidence of ataxia, dysmetria or tremor.  Gait and station: Patient walks without assistive device .Tandem gait deferred . Turns with 3 Steps.  Assessment:  After physical and neurologic examination, review of laboratory studies,  Personal review of imaging studies, reports of other /same  Imaging studies, results of polysomnography and / or neurophysiology testing and pre-existing records as far as provided in visit., my assessment is   1) Mr. Paglia had a well documented positive PSG with M SLT a diagnostic of narcolepsy.  Clinical clinical picture is that of narcolepsy with cataplexy.  He has been able to drive safely despite using stimulant, namely Adderall.  I will refill Adderall that he can resume driving. He has not used Nuvigil or Provigil in the past, he continues  to use a lot of caffeine on the side.  2) smoking cessation needed  3) hydration needed- with water, not mountain dew. This may decrease nocturia.    The patient was advised of the nature of the diagnosed disorder , the treatment options and the  risks for general health and wellness arising from not treating the condition.   I spent more than 45 minutes of face to face time with the patient.  Greater than 50% of time was spent in counseling and coordination of care. We have discussed the diagnosis and differential and I answered the patient's questions.    Plan:  Treatment plan and additional workup : Adderall  IR 10 mg bid.  Rv in 6 month with NP.   Larey Seat, MD 78/11/7542, 92:01 AM  Certified in Neurology by ABPN Certified in [place] by [facility] [address], [place], [place] [address]

## 2017-04-26 ENCOUNTER — Ambulatory Visit: Payer: 59 | Admitting: Neurology

## 2017-05-23 ENCOUNTER — Other Ambulatory Visit: Payer: Self-pay | Admitting: Neurology

## 2017-05-23 ENCOUNTER — Telehealth: Payer: Self-pay

## 2017-05-23 MED ORDER — AMPHETAMINE-DEXTROAMPHETAMINE 10 MG PO TABS: 10.0000 mg | ORAL_TABLET | Freq: Every day | ORAL | 0 refills | Status: DC

## 2017-05-23 NOTE — Telephone Encounter (Signed)
Called the patient to make him aware that his script is ready for pick up.

## 2017-05-23 NOTE — Telephone Encounter (Signed)
Patient is calling for a refill on his Adderall 10mg . He would like a call when it's ready for pick up.

## 2017-06-05 ENCOUNTER — Telehealth: Payer: Self-pay | Admitting: Neurology

## 2017-06-05 MED ORDER — AMPHETAMINE-DEXTROAMPHETAMINE 10 MG PO TABS: 10.0000 mg | ORAL_TABLET | Freq: Two times a day (BID) | ORAL | 0 refills | Status: DC

## 2017-06-05 NOTE — Addendum Note (Signed)
Addended by: Larey Seat on: 06/05/2017 04:19 PM   Modules accepted: Orders

## 2017-06-05 NOTE — Telephone Encounter (Signed)
Called the pt and made him aware of the increase. The patient verbalized understanding and then stated that he would come pick the new script up.

## 2017-06-05 NOTE — Telephone Encounter (Signed)
Patient calling to discuss increasing dosage of amphetamine-dextroamphetamine (ADDERALL) 10 MG tablet.

## 2017-06-05 NOTE — Telephone Encounter (Signed)
Yes, it would be possible to increase to 10 mg bid . I will adjust his prescription.

## 2017-06-05 NOTE — Telephone Encounter (Signed)
Pt has been taken the 10 mg in the am and around lunch time he states that it wears off and he starts to drag. Pt states that couple years ago when he was seeing her he was taking the 10 mg BID. Patient is asking if we can increase this dosage.

## 2017-06-21 ENCOUNTER — Telehealth: Payer: Self-pay

## 2017-06-21 NOTE — Telephone Encounter (Signed)
I received a prior auth request for Adderall. I have completed and submitted the PA and should have a determination within 48-72 hours.  Marco Jackson - PA Case ID: [id number]

## 2017-06-22 NOTE — Telephone Encounter (Signed)
PA approved until 06/21/2018 by [facility].  PA File ID [id number]

## 2017-08-03 ENCOUNTER — Telehealth: Payer: Self-pay | Admitting: Neurology

## 2017-08-03 ENCOUNTER — Other Ambulatory Visit: Payer: Self-pay | Admitting: Neurology

## 2017-08-03 MED ORDER — AMPHETAMINE-DEXTROAMPHETAMINE 10 MG PO TABS: 10.0000 mg | ORAL_TABLET | Freq: Two times a day (BID) | ORAL | 0 refills | Status: DC

## 2017-08-03 NOTE — Telephone Encounter (Signed)
Pt is requesting a refill for amphetamine-dextroamphetamine (ADDERALL) 10 MG tablet pt is aware that the office will close at noon tomorrow

## 2017-08-03 NOTE — Telephone Encounter (Signed)
Pt's script will be ready for pick up this afternoon

## 2017-09-04 ENCOUNTER — Other Ambulatory Visit: Payer: Self-pay | Admitting: Neurology

## 2017-09-04 ENCOUNTER — Telehealth: Payer: Self-pay | Admitting: Neurology

## 2017-09-04 MED ORDER — AMPHETAMINE-DEXTROAMPHETAMINE 10 MG PO TABS: 10.0000 mg | ORAL_TABLET | Freq: Two times a day (BID) | ORAL | 0 refills | Status: DC

## 2017-09-04 NOTE — Telephone Encounter (Signed)
Routed to Dr Brett Fairy to refill. When it goes through as long as there are no complications this should be sent electronically to the pharmacy on file. Will call the patient once Dr Brett Fairy has refilled.

## 2017-09-04 NOTE — Telephone Encounter (Signed)
Pt request refill for amphetamine-dextroamphetamine (ADDERALL) 10 MG tablet °

## 2017-10-16 ENCOUNTER — Other Ambulatory Visit: Payer: Self-pay | Admitting: Neurology

## 2017-10-16 ENCOUNTER — Telehealth: Payer: Self-pay | Admitting: Neurology

## 2017-10-16 MED ORDER — AMPHETAMINE-DEXTROAMPHETAMINE 10 MG PO TABS: 10.0000 mg | ORAL_TABLET | Freq: Two times a day (BID) | ORAL | 0 refills | Status: DC

## 2017-10-16 NOTE — Telephone Encounter (Signed)
Pt has called for refill on his amphetamine-dextroamphetamine (ADDERALL) 10 MG tablet He is asking that it be sent to  [place], [place]. [phone] (Phone) [phone] (Fax)

## 2017-10-16 NOTE — Telephone Encounter (Signed)
I have forwarded to Dr Brett Fairy for her to review.

## 2017-11-28 ENCOUNTER — Telehealth: Payer: Self-pay | Admitting: Neurology

## 2017-11-28 ENCOUNTER — Other Ambulatory Visit: Payer: Self-pay | Admitting: Neurology

## 2017-11-28 MED ORDER — AMPHETAMINE-DEXTROAMPHETAMINE 10 MG PO TABS: 10.0000 mg | ORAL_TABLET | Freq: Two times a day (BID) | ORAL | 0 refills | Status: DC

## 2017-11-28 NOTE — Telephone Encounter (Signed)
Pt requesting refills for amphetamine-dextroamphetamine (ADDERALL) 10 MG tablet sent to [facility]

## 2017-11-28 NOTE — Telephone Encounter (Signed)
I have routed this request to Dr Dohmeier for review. The pt is due for the medication and [place] registry was verified.  

## 2018-03-06 ENCOUNTER — Ambulatory Visit: Payer: Self-pay | Admitting: Neurology

## 2018-03-06 ENCOUNTER — Encounter: Payer: Self-pay | Admitting: Neurology

## 2018-03-06 VITALS — BP 121/64 | HR 63 | Ht 66.0 in | Wt 190.0 lb

## 2018-03-06 DIAGNOSIS — G47419 Narcolepsy without cataplexy: Secondary | ICD-10-CM

## 2018-03-06 DIAGNOSIS — Z720 Tobacco use: Secondary | ICD-10-CM

## 2018-03-06 DIAGNOSIS — F901 Attention-deficit hyperactivity disorder, predominantly hyperactive type: Secondary | ICD-10-CM

## 2018-03-06 MED ORDER — BUPROPION HCL ER (XL) 150 MG PO TB24: 150.0000 mg | ORAL_TABLET | Freq: Every day | ORAL | 5 refills | Status: AC

## 2018-03-06 MED ORDER — AMPHETAMINE-DEXTROAMPHETAMINE 20 MG PO TABS: 20.0000 mg | ORAL_TABLET | Freq: Every day | ORAL | 0 refills | Status: DC

## 2018-03-06 NOTE — Progress Notes (Signed)
SLEEP MED[facility]INE CLINIC   Provider:  Larey Seat, [place] D  Primary Care Physician:  Carollee Herter, Alferd Apa, DO Referring Provider:    Chief Complaint  Patient presents with  . Follow-up    pt with wife, rm 10.pt wife states that with the medication that he is on he is irritable and mean. pt states the adderall doesnt work very well and wife states that he is ill tempered.     HPI:  Marco Jackson is a 52 y.o. male , in a RV on 03-06-2018.  Marco Jackson is using Adderall to avoid falling asleep in daytime, but he has become very irritable and somewhat cross on the medication.  He had failed modafinil in the form of Nuvigil and Provigil before.  Is your Adderall he is using a low dose of 5 mg immediate release Adderall IR.  He does not have high blood pressure, he has no history of cardiac arrhythmia or myocardial infarction, coronary artery disease.  Adderall also does not in the long-term change his propensity to fall asleep and his Epworth Sleepiness Scale was endorsed at 13 points, fatigue severity scale at 26 out of 63 points.  He continues to smoke and has great difficulties to reduce or quit use of tobacco products.  He seems to not have gained weight remains at 185 pounds. In addition he has asthma,  nicotine abuse, ED, and degenerative disc disease with a pinched nerve at the fourth cervical level- he also developed left leg sciatica,and numbness. He would like to go back and see an orthopedist,  Dr French Ana. His restlessness, and level of agitation is high. He remains uninsured. I would like to change him to an extended release form of adderall or ritalin for a more even release. He cannot afford Xyrem.     He was seen here as in a referral to re-instate care for narcolepsy.  I have seen Marco Jackson regularly until 12/2013, over 3 years ago.  In the meantime a lot of things have happened;  he stopped driving commercially, did not need the stimulant medication to keep him awake, underwent a divorce  and got . He is currently without health insurance coverage. He is planning to return to driving and would need medication to allow him to do so safely.  He endorsed the fatigue severity scale today at 49 point and the Epworth sleepiness score at 19 out of 24 points. The patient was originally referred by Dr. Durward Fortes for diagnostic sleep study after he had revealed to be excessively daytime sleepy and to consume a lot of caffeine to help him stay awake in daytime.  He was placed on Adderall.  The hypersomnia persisted.   In addition he has asthma, continued nicotine use, ED, and degenerative disc disease with a pinched nerve at the fourth cervical level- he also developed left leg sciatica,and numbness.    He was diagnosed with narcolepsy and cataplexy in 2008, his original polysomnography showed an AHI of only 2.1 and no other physiologic sleep disorders and was valid for an M SLT to follow. The MSLT showed 2 sleep onset REM in 4 naps with a mean sleep latency of only 4.8 minutes, REM latency of 6.8 minutes.  The patient reported clinically hypersomnia, sleep attacks and cataplexy. His knees buckled when he laughs, sometimes when he is startled or when he is angry he felt a sinking feeling. This has continued.  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  He continues to drink caffeine to help him throughout the day with excessive daytime sleepiness.  The Epworth and fatigue severity score below.    Sleep habits are as follows: He usually goes to bed between 830 and 9:30 PM and is usually asleep minutes.  He sleeps through until about midnight will have one bathroom break, will return back to sleep and may have another couple of bathroom breaks later in the morning.  He rises at 4:30 AM.  He reports that he feels diaphoretic and sometimes clammy, sometimes with palpitations.  Does not wake up with headaches but with a very dry  mouth. He gets 7 hours or more of nocturnal sleep- He still struggles to not nap in daytime. He has a lot of nap attacks.   Sleep medical history and family sleep history: The patient has been sleepier than his peers all his life, he was adopted there is no biological family history of sleep disorders available. 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  Continues 1 pp d smoking for 30 years, has been using 4-6 caffeinated beverages, mountain dew. Alcohol use, rarely.    Review of Systems: Out of a complete 14 system review, the patient complains of only the following symptoms, and all other reviewed systems are negative.  EDS- vivid dreams, cataplectic attacks.   Epworth score  19 , Fatigue severity score 49  , depression score 1/ 15    Social History   Socioeconomic History  . Marital status: Married    Spouse name: [name]  . Number of children: 1  . Years of education: 35  . Highest education level: Not on file  Occupational History  . Occupation: Truck Education administrator: [employer]  Social Needs  . Financial resource strain: Not on file  . Food insecurity:    Worry: Not on file    Inability: Not on file  . Transportation needs:    Medical: Not on file    Non-medical: Not on file  Tobacco Use  . Smoking status: Current Every Day Smoker    Packs/day: 1.00    Years: 60.00    Pack years: 60.00  . Smokeless tobacco: Never Used  . Tobacco comment: used chantix before   Substance and Sexual Activity  . Alcohol use: Yes    Comment: very little  . Drug use: No  . Sexual activity: Yes    Partners: Female  Lifestyle  . Physical activity:    Days per week: Not on file    Minutes per session: Not on file  . Stress: Not on file  Relationships  . Social connections:    Talks on phone: Not on file    Gets together: Not on  file    Attends religious service: Not on file    Active member of club or organization: Not on file    Attends meetings of clubs or organizations: Not on file    Relationship status: Not on file  . Intimate partner violence:    Fear of current or ex partner: Not on file    Emotionally abused: Not on file    Physically abused: Not on file    Forced sexual activity: Not on file  Other Topics Concern  .  Not on file  Social History Narrative   Patient is married [name]) and lives at home with his wife.   Patient is a truck Sport and exercise psychologist.   Patient has an 11th grade education.   Patient is right-handed.   Patient drinks 4 caffeine drinks per day.    Family History  Adopted: Yes    Past Medical History:  Diagnosis Date  . Asthma   . DDD (degenerative disc disease)    C4- pinched nerve  . ED (erectile dysfunction)   . 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 Narcolepsy and cataplexy     Past Surgical History:  Procedure Laterality Date  . CHOLECYSTECTOMY    . hemorrhoidectomy    . PATIENT STATES HE HAS NOT HAD A VASECTOMY    . STERILIZATION      Current Outpatient Medications  Medication Sig Dispense Refill  . amphetamine-dextroamphetamine (ADDERALL) 10 MG tablet Take 1 tablet (10 mg total) by mouth 2 (two) times daily with a meal. 60 tablet 0   No current facility-administered medications for this visit.     Allergies as of 03/06/2018  . (No Known Allergies)    Vitals: BP 121/64   Pulse 63   Ht 5\' 6"  (1.676 m)   Wt 190 lb (86.2 kg)   BMI 30.67 kg/m  Last Weight:  Wt Readings from Last 1 Encounters:  03/06/18 190 lb (86.2 kg)   GGY:IRSW mass index is 30.67 kg/m.     Last Height:   Ht Readings from Last 1 Encounters:  03/06/18 5\' 6"  (1.676 m)    Physical exam:  General: The patient is awake, alert and appears not in acute distress. The patient is well groomed. Head: Normocephalic, atraumatic. Neck is supple. Mallampati 2,  neck  circumference:16. 25 . Nasal airflow patent ,  Retrognathia is seen.  Cardiovascular:  Regular rate and rhythm, without  murmurs or carotid bruit, and without distended neck veins. Respiratory: Lungs are wheezing to  auscultation. Skin:  Without evidence of edema, or rash Trunk: BMI is 29  Neurologic exam : The patient is awake and alert, oriented to place and time.    Attention span & concentration ability appears normal.  Speech is fluent,  without dysarthria, dysphonia or aphasia.  Mood and affect are appropriate.  Cranial nerves: Pupils are equal and briskly reactive to light.  Extraocular movements  in vertical and horizontal planes intact and without nystagmus. Visual fields by finger perimetry are intact.Hearing to finger rub intact.  Facial sensation intact to fine touch. Facial motor strength is symmetric and tongue and uvula move midline. Shoulder shrug was symmetrical.   Motor exam:  Normal tone, muscle bulk and symmetric strength in all extremities. Sensory:  Fine touch, pinprick and vibration were  Normal. No residual numbness in left leg.  Coordination: Rapid alternating movements in the fingers/hands are normal. Finger-to-nose maneuver  without evidence of ataxia, dysmetria or tremor.  Gait and station: Patient walks without assistive device .Tandem gait deferred . Turns with 3 Steps.  Assessment:  After physical and neurologic examination, review of laboratory studies,  Personal review of imaging studies, reports of other /same  Imaging studies, results of polysomnography and / or neurophysiology testing and pre-existing records as far as provided in visit., my assessment is   1) Mr. Mccants had a well documented positive PSG with M SLT a diagnostic of narcolepsy.  Clinical clinical picture is that of narcolepsy with cataplexy.  He has been able to drive safely despite  using stimulant, namely Adderall.  I will refill Adderall that he can resume driving. 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  2) smoking cessation needed  3) hydration needed- with water, not mountain dew. This may decrease nocturia.    The patient was advised of the nature of the diagnosed disorder , the treatment options and the  risks for general health and wellness arising from not treating the condition.   I spent more than 45 minutes of face to face time with the patient.  Greater than 50% of time was spent in counseling and coordination of care. We have discussed the diagnosis and differential and I answered the patient's questions.    Plan:  Treatment plan and additional workup : Adderall  IR 10 mg bid.  Rv in 6 month with NP.   Larey Seat, MD 2/37/6283, 1:51 PM  Certified in Neurology by ABPN Certified in [place] by [facility] [address] [place], [place] [address]

## 2018-03-06 NOTE — Patient Instructions (Signed)
Amphetamine; Dextroamphetamine tablets What is this medicine? AMPHETAMINE; DEXTROAMPHETAMINE(am FET a meen; dex troe am FET a meen) is used to treat attention-deficit hyperactivity disorder (ADHD). It may also be used for narcolepsy. Federal law prohibits giving this medicine to any person other than the person for whom it was prescribed. Do not share this medicine with anyone else. This medicine may be used for other purposes; ask your health care provider or pharmacist if you have questions. COMMON BRAND NAME(S): Adderall What should I tell my health care provider before I take this medicine? They need to know if you have any of these conditions: -anxiety or panic attacks -circulation problems in fingers and toes -glaucoma -hardening or blockages of the arteries or heart blood vessels -heart disease or a heart defect -high blood pressure -history of a drug or alcohol abuse problem -history of stroke -kidney disease -liver disease -mental illness -seizures -suicidal thoughts, plans, or attempt; a previous suicide attempt by you or a family member -thyroid disease -Tourette's syndrome -an unusual or allergic reaction to dextroamphetamine, other amphetamines, other medicines, foods, dyes, or preservatives -pregnant or trying to get pregnant -breast-feeding How should I use this medicine? Take this medicine by mouth with a glass of water. Follow the directions on the prescription label. Take your doses at regular intervals. Do not take your medicine more often than directed. Do not suddenly stop your medicine. You must gradually reduce the dose or you may feel withdrawal effects. Ask your doctor or health care professional for advice. Talk to your pediatrician regarding the use of this medicine in children. Special care may be needed. While this drug may be prescribed for children as young as 3 years for selected conditions, precautions do apply. Overdosage: If you think you have taken too  much of this medicine contact a poison control center or emergency room at once. NOTE: This medicine is only for you. Do not share this medicine with others. What if I miss a dose? If you miss a dose, take it as soon as you can. If it is almost time for your next dose, take only that dose. Do not take double or extra doses. What may interact with this medicine? Do not take this medicine with any of the following medications: -MAOIS like Carbex, Eldepryl, Marplan, Nardil, and Parnate -other stimulant medicines for attention disorders, weight loss, or to stay awake This medicine may also interact with the following medications: -acetazolamide -ammonium chloride -antacids -ascorbic acid -atomoxetine -caffeine -certain medicines for blood pressure -certain medicines for depression, anxiety, or psychotic disturbances -certain medicines for seizures like carbamazepine, phenobarbital, phenytoin -certain medicines for stomach problems like cimetidine, famotidine, omeprazole, lansoprazole -cold or allergy medicines -glutamic acid -lithium -meperidine -methenamine; sodium acid phosphate -narcotic medicines for pain -norepinephrine -phenothiazines like chlorpromazine, mesoridazine, prochlorperazine, thioridazine -sodium acid phosphate -sodium bicarbonate This list may not describe all possible interactions. Give your health care provider a list of all the medicines, herbs, non-prescription drugs, or dietary supplements you use. Also tell them if you smoke, drink alcohol, or use illegal drugs. Some items may interact with your medicine. What should I watch for while using this medicine? Visit your doctor or health care professional for regular checks on your progress. This prescription requires that you follow special procedures with your doctor and pharmacy. You will need to have a new written prescription from your doctor every time you need a refill. This medicine may affect your  concentration, or hide signs of tiredness. Until you know how this   medicine affects you, do not drive, ride a bicycle, use machinery, or do anything that needs mental alertness. Tell your doctor or health care professional if this medicine loses its effects, or if you feel you need to take more than the prescribed amount. Do not change the dosage without talking to your doctor or health care professional. Decreased appetite is a common side effect when starting this medicine. Eating small, frequent meals or snacks can help. Talk to your doctor if you continue to have poor eating habits. Height and weight growth of a child taking this medicine will be monitored closely. Do not take this medicine close to bedtime. It may prevent you from sleeping. If you are going to need surgery, a MRI, CT scan, or other procedure, tell your doctor that you are taking this medicine. You may need to stop taking this medicine before the procedure. Tell your doctor or healthcare professional right away if you notice unexplained wounds on your fingers and toes while taking this medicine. You should also tell your healthcare provider if you experience numbness or pain, changes in the skin color, or sensitivity to temperature in your fingers or toes. What side effects may I notice from receiving this medicine? Side effects that you should report to your doctor or health care professional as soon as possible: -allergic reactions like skin rash, itching or hives, swelling of the face, lips, or tongue -changes in vision -chest pain or chest tightness -confusion, trouble speaking or understanding -fast, irregular heartbeat -fingers or toes feel numb, cool, painful -hallucination, loss of contact with reality -high blood pressure -males: prolonged or painful erection -seizures -severe headaches -shortness of breath -suicidal thoughts or other mood changes -trouble walking, dizziness, loss of balance or  coordination -uncontrollable head, mouth, neck, arm, or leg movements Side effects that usually do not require medical attention (report to your doctor or health care professional if they continue or are bothersome): -anxious -headache -loss of appetite -nausea, vomiting -trouble sleeping -weight loss This list may not describe all possible side effects. Call your doctor for medical advice about side effects. You may report side effects to FDA at 1-800-FDA-1088. Where should I keep my medicine? Keep out of the reach of children. This medicine can be abused. Keep your medicine in a safe place to protect it from theft. Do not share this medicine with anyone. Selling or giving away this medicine is dangerous and against the law. Store at room temperature between 15 and 30 degrees C (59 and 86 degrees F). Keep container tightly closed. Throw away any unused medicine after the expiration date. Dispose of properly. This medicine may cause accidental overdose and death if it is taken by other adults, children, or pets. Mix any unused medicine with a substance like cat litter or coffee grounds. Then throw the medicine away in a sealed container like a sealed bag or a coffee can with a lid. Do not use the medicine after the expiration date. NOTE: This sheet is a summary. It may not cover all possible information. If you have questions about this medicine, talk to your doctor, pharmacist, or health care provider.  2018 Elsevier/Gold Standard (2014-04-09 18:44:41)  

## 2018-04-18 ENCOUNTER — Other Ambulatory Visit: Payer: Self-pay | Admitting: Neurology

## 2018-04-18 MED ORDER — AMPHETAMINE-DEXTROAMPHETAMINE 20 MG PO TABS: 20.0000 mg | ORAL_TABLET | Freq: Every day | ORAL | 0 refills | Status: DC

## 2018-04-18 NOTE — Telephone Encounter (Signed)
I called pt. Verified he was requesting refill for Wellbutrin (not buspar) and adderall. Advised Dr. Brett Fairy already sent rx Wellbutrin on 03/06/18 #30, 5 refills to [facility]. He should have refills for that at the pharmacy.  I will send his request for adderall refill to Dr. Brett Fairy.  He verbalized understanding and appreciation.

## 2018-04-18 NOTE — Telephone Encounter (Signed)
The patient is calling requesting a refill on his Buspar and Adderral.

## 2018-05-16 ENCOUNTER — Other Ambulatory Visit: Payer: Self-pay | Admitting: Neurology

## 2018-05-16 ENCOUNTER — Telehealth: Payer: Self-pay | Admitting: Neurology

## 2018-05-16 MED ORDER — AMPHETAMINE-DEXTROAMPHETAMINE 20 MG PO TABS: 20.0000 mg | ORAL_TABLET | Freq: Every day | ORAL | 0 refills | Status: DC

## 2018-05-16 NOTE — Telephone Encounter (Signed)
I have routed this request to Dr Dohmeier for review. The pt is due for the medication and  registry was verified.  

## 2018-05-16 NOTE — Telephone Encounter (Signed)
Pt requesting refills for amphetamine-dextroamphetamine (ADDERALL) 20 MG tablet sent to [facility]

## 2018-05-21 ENCOUNTER — Other Ambulatory Visit: Payer: Self-pay | Admitting: Neurology

## 2018-05-21 NOTE — Telephone Encounter (Signed)
Pt is calling for a refill on his amphetamine-dextroamphetamine (ADDERALL) 20 MG tablet [facility]

## 2018-05-21 NOTE — Addendum Note (Signed)
Addended by: Hope Pigeon on: 05/21/2018 03:35 PM   Modules accepted: Orders

## 2018-05-22 MED ORDER — AMPHETAMINE-DEXTROAMPHETAMINE 20 MG PO TABS: 20.0000 mg | ORAL_TABLET | Freq: Every day | ORAL | 0 refills | Status: AC

## 2018-06-11 ENCOUNTER — Telehealth: Payer: Self-pay | Admitting: Neurology

## 2018-06-11 NOTE — Telephone Encounter (Signed)
Called the patient and was able to have his apt moved up in the day on the 26th. Pt was scheduled for 2:30 pm and he agreed to come in on 26th at 10:00 am with check in at 9:30 am.

## 2018-06-14 ENCOUNTER — Ambulatory Visit: Payer: Self-pay | Admitting: Neurology

## 2018-06-14 ENCOUNTER — Ambulatory Visit: Payer: BLUE CROSS/BLUE SHIELD | Admitting: Neurology

## 2018-06-14 ENCOUNTER — Telehealth: Payer: Self-pay | Admitting: Neurology

## 2018-06-14 NOTE — Telephone Encounter (Signed)
Pt showed to apt and is now self pay and doesn't have insurance. Patient claims he was unaware he would have to have the money up front. Pt was unable to be seen today since didn't have the copay.

## 2018-06-15 ENCOUNTER — Encounter: Payer: Self-pay | Admitting: Neurology

## 2018-09-01 IMAGING — MR MR SHOULDER*L* W/O CM
5 series · 37 of 40 positions shown · non-contrast
Comparison: None.

CLINICAL DATA: Left shoulder pain and limited range of motion since
a slip and fall on a wet [HOSPITAL] weeks ago.

EXAM:
MRI OF THE LEFT SHOULDER WITHOUT CONTRAST
TECHNIQUE: Multiplanar, multisequence MR imaging of the shoulder was performed.
No intravenous contrast was administered.

[Series 6: T2 fat-sat · axial · left · 3.0mm · 0.44mm/px · z∈[-61,+35]mm · 8 of 27 slices shown (1 of 3)]
[im 1/27]
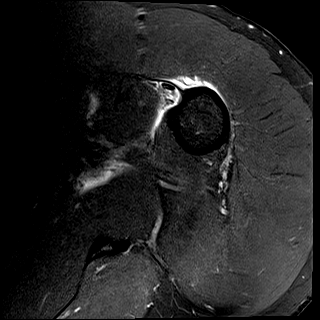
[im 4/27]
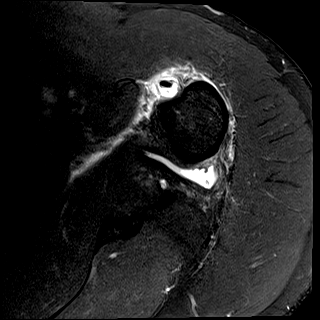
[im 8/27]
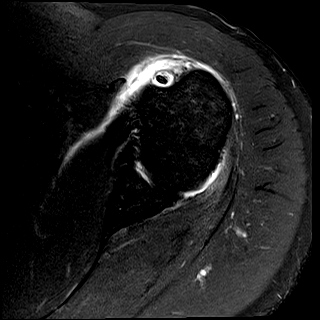
[im 12/27]
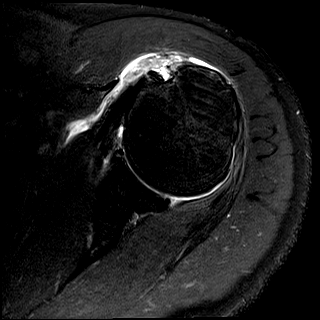
[im 15/27]
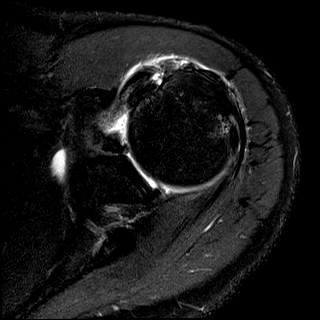
[im 19/27]
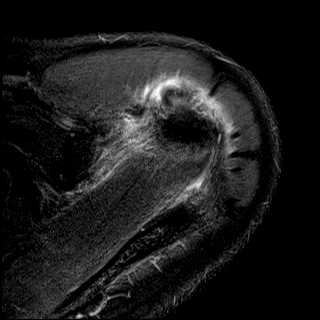
[im 23/27]
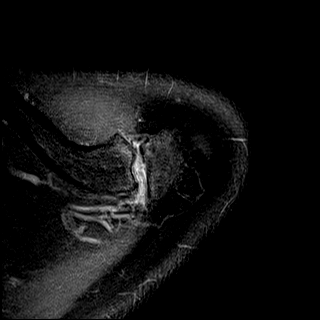
[im 27/27]
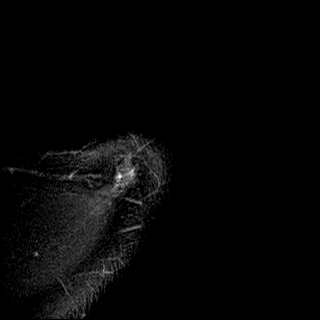

[Series 7: T2 fat-sat · oblique · left · 3.0mm · 0.55mm/px · 8 of 22 slices shown (2 of 3)]
[im 1/22]
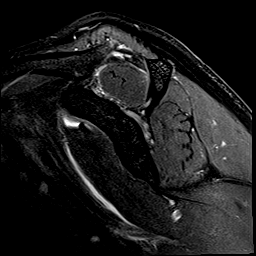
[im 4/22]
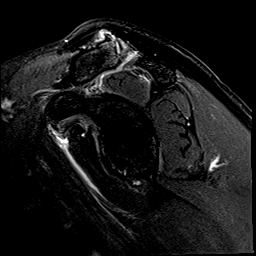
[im 7/22]
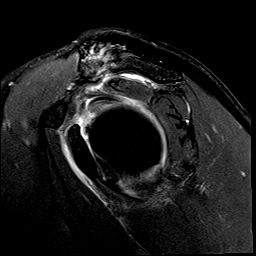
[im 10/22]
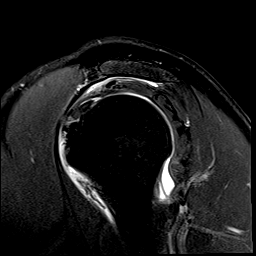
[im 13/22]
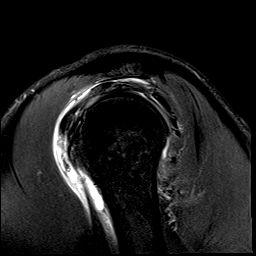
[im 16/22]
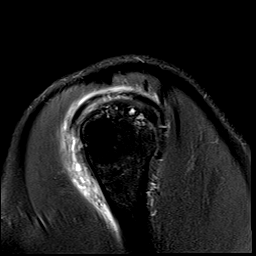
[im 19/22]
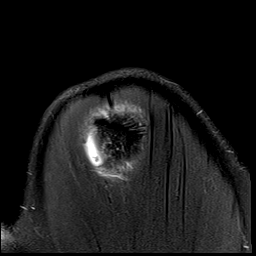
[im 22/22]
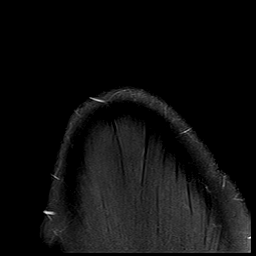

[Series 8: T1 · oblique · left · 3.0mm · 0.22mm/px · 5 of 22 slices shown]
[im 1/22]
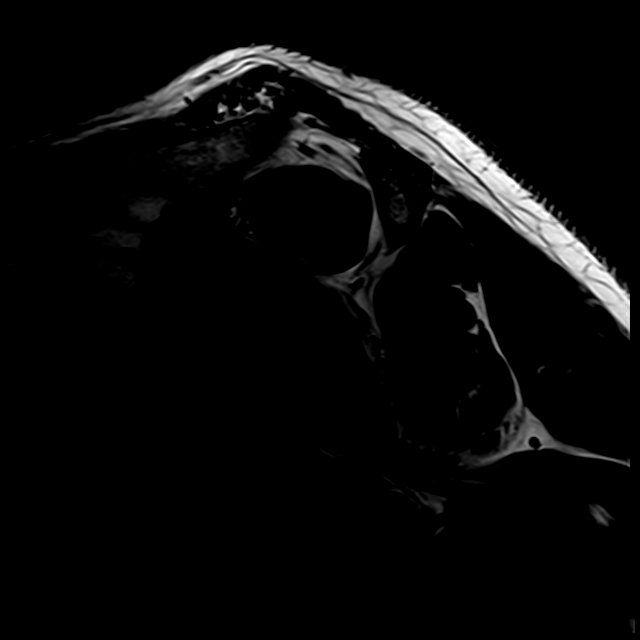
[im 4/22]
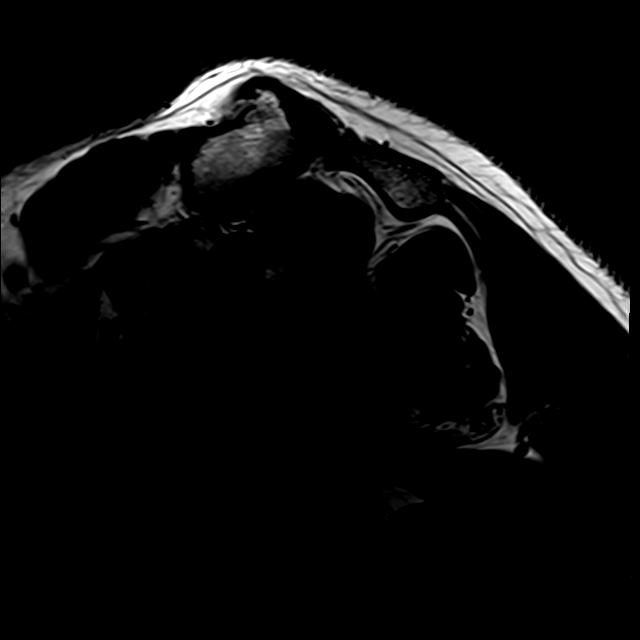
[im 7/22]
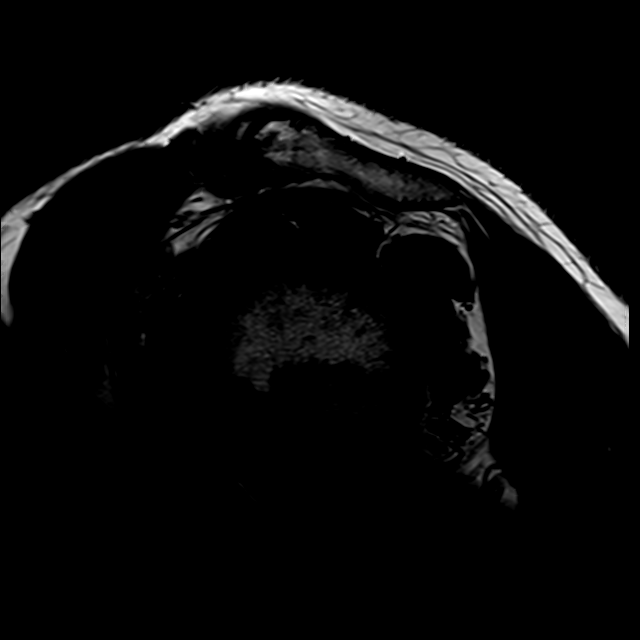
[im 10/22]
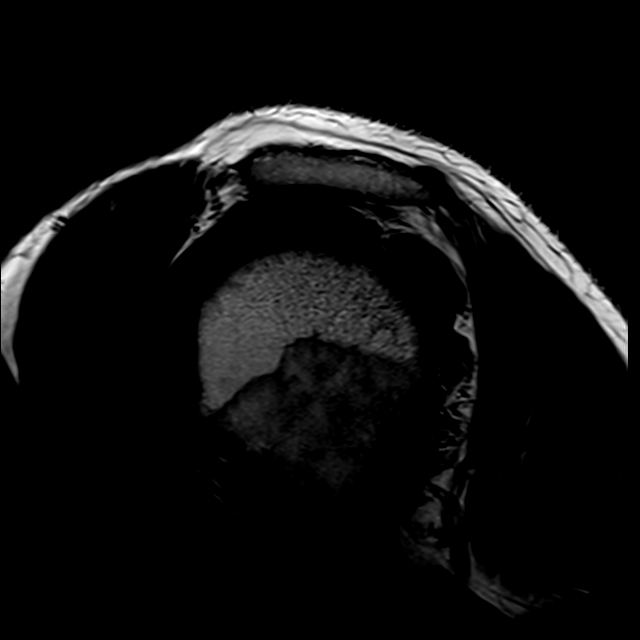
[im 13/22]
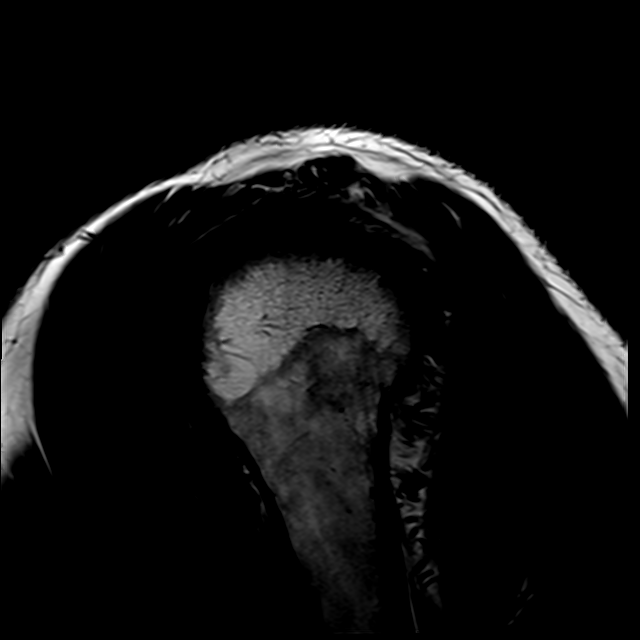

[Series 9: PD · oblique · left · 3.0mm · 0.44mm/px · 8 of 23 slices shown]
[im 1/23]
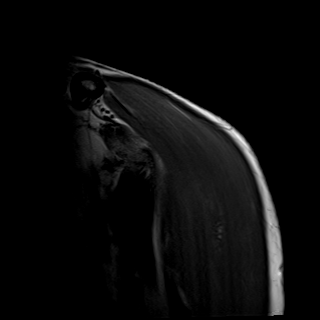
[im 4/23]
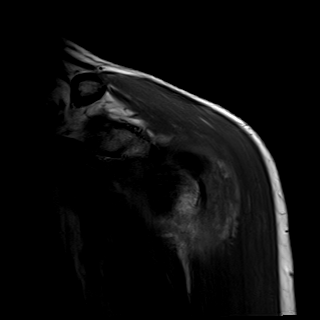
[im 7/23]
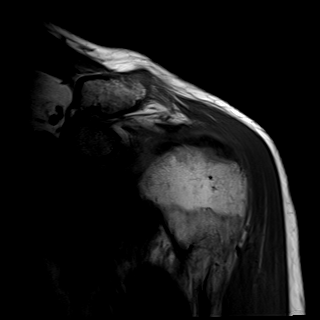
[im 10/23]
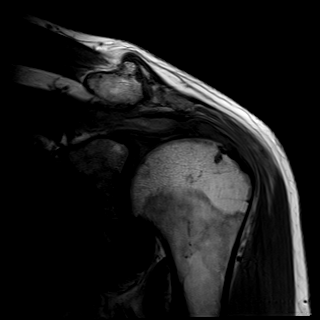
[im 13/23]
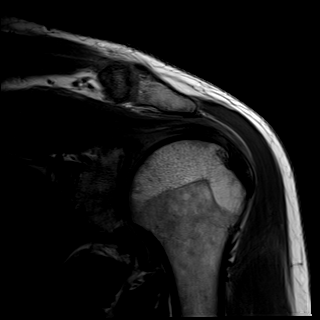
[im 16/23]
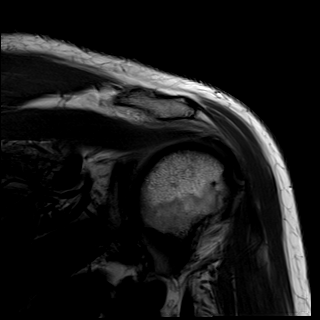
[im 19/23]
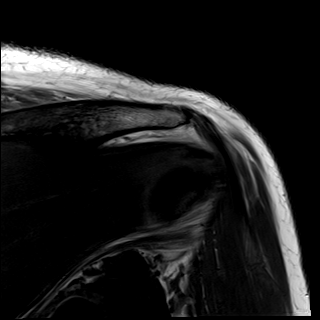
[im 23/23]
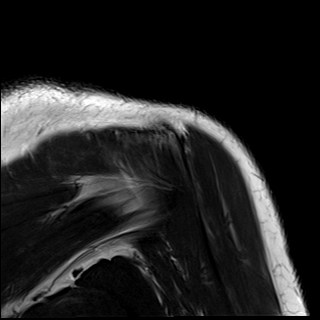

[Series 10: T2 fat-sat · oblique · left · 3.0mm · 0.55mm/px · 8 of 23 slices shown (3 of 3)]
[im 1/23]
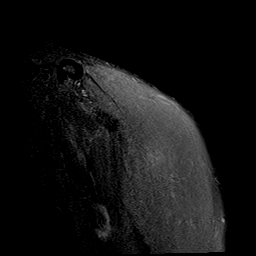
[im 4/23]
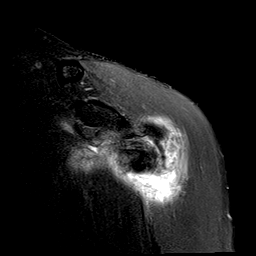
[im 7/23]
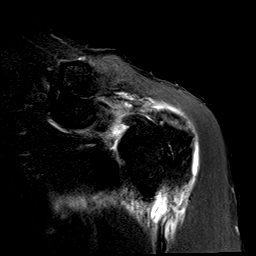
[im 10/23]
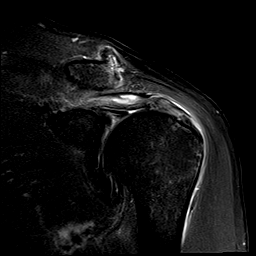
[im 13/23]
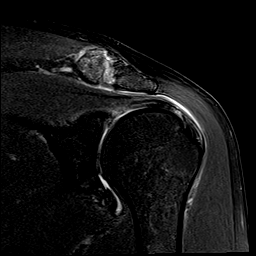
[im 16/23]
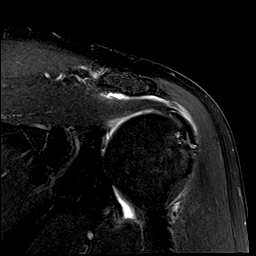
[im 19/23]
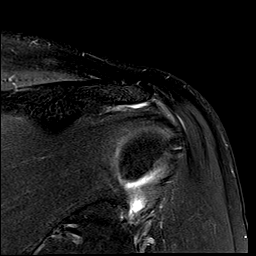
[im 23/23]
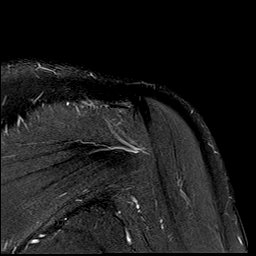

[37 of 40 positions shown; findings below may reference images not displayed]

FINDINGS: Rotator cuff: The patient has rotator cuff tendinopathy appearing
worst in the supraspinatus where it is severe. There is some
fissuring of the distal supraspinatus but no full-thickness tear or
retracted tendon is identified.

Muscles:  No atrophy or focal lesion.

Biceps long head: Intact with tendinopathy of the intra-articular
segment identified.

Acromioclavicular Joint: Severe degenerative change appears markedly
advanced for age. Type 2 acromion. Fluid in the
subacromial/subdeltoid bursa contains debris and is consistent with
bursitis.

Glenohumeral Joint: Cartilage loss is seen along the inferior
glenoid.

Labrum: The superior labrum is severely degenerated and likely torn.

Bones:  No fracture or worrisome lesion.

Other: None.
IMPRESSION: Rotator cuff tendinopathy is worst in the supraspinatus where it
appears severe. No focal tear.

Severe appearing acromioclavicular osteoarthritis is markedly
advanced for age. Mild to moderate glenohumeral osteoarthritis also
seen.

Tendinopathy of the intra-articular long head of biceps without
tear.

Severely degenerated superior labrum is likely torn.

Prominent subacromial/subdeltoid fluid containing debris consistent
with bursitis.

## 2018-09-10 ENCOUNTER — Encounter

## 2018-09-10 ENCOUNTER — Ambulatory Visit: Payer: BLUE CROSS/BLUE SHIELD | Admitting: Neurology

## 2022-07-27 ENCOUNTER — Encounter: Payer: Self-pay | Admitting: Gastroenterology

## 2024-05-21 ENCOUNTER — Emergency Department (HOSPITAL_COMMUNITY)
Admission: EM | Admit: 2024-05-21 | Discharge: 2024-05-21 | Discharge status: Against Medical Advice | Attending: Emergency Medicine | Admitting: Emergency Medicine

## 2024-05-21 ENCOUNTER — Encounter (HOSPITAL_COMMUNITY): Payer: Self-pay | Admitting: Emergency Medicine

## 2024-05-21 DIAGNOSIS — R42 Dizziness and giddiness: Secondary | ICD-10-CM | POA: Diagnosis not present

## 2024-05-21 DIAGNOSIS — Z5321 Procedure and treatment not carried out due to patient leaving prior to being seen by health care provider: Secondary | ICD-10-CM | POA: Diagnosis not present

## 2024-05-21 DIAGNOSIS — H9319 Tinnitus, unspecified ear: Secondary | ICD-10-CM | POA: Diagnosis present

## 2024-05-21 NOTE — ED Triage Notes (Signed)
 Pt arriving POV with concern for balance issues and ringing of the ears for approx 1 month. Pt states he is hoping for a referral to ENT.
